# Patient Record
Sex: Female | Born: 1950 | Race: White | Hispanic: No | Marital: Married | Smoking: Never smoker
Health system: Southern US, Community
[De-identification: ages and names within clinical notes are randomized; demographics above are authoritative.]

## PROBLEM LIST (undated history)

## (undated) DIAGNOSIS — F32A Depression, unspecified: Secondary | ICD-10-CM

## (undated) DIAGNOSIS — R7303 Prediabetes: Secondary | ICD-10-CM

## (undated) DIAGNOSIS — F419 Anxiety disorder, unspecified: Secondary | ICD-10-CM

## (undated) DIAGNOSIS — I10 Essential (primary) hypertension: Secondary | ICD-10-CM

## (undated) DIAGNOSIS — M1990 Unspecified osteoarthritis, unspecified site: Secondary | ICD-10-CM

## (undated) HISTORY — PX: CHOLECYSTECTOMY: SHX55

## (undated) HISTORY — PX: APPENDECTOMY: SHX54

## (undated) HISTORY — PX: CATARACT EXTRACTION W/ INTRAOCULAR LENS IMPLANT: SHX1309

## (undated) HISTORY — PX: LAPAROSCOPIC TOTAL HYSTERECTOMY: SUR800

## (undated) HISTORY — PX: HAND SURGERY: SHX662

## (undated) HISTORY — PX: REDUCTION MAMMAPLASTY: SUR839

---

## 1999-10-12 ENCOUNTER — Ambulatory Visit (HOSPITAL_COMMUNITY): Admission: RE | Admit: 1999-10-12 | Discharge: 1999-10-12 | Payer: Self-pay | Admitting: Family Medicine

## 1999-10-12 ENCOUNTER — Encounter: Payer: Self-pay | Admitting: Family Medicine

## 2000-02-03 ENCOUNTER — Ambulatory Visit (HOSPITAL_COMMUNITY): Admission: RE | Admit: 2000-02-03 | Discharge: 2000-02-03 | Payer: Self-pay | Admitting: Family Medicine

## 2000-02-03 ENCOUNTER — Encounter: Payer: Self-pay | Admitting: Family Medicine

## 2011-05-23 ENCOUNTER — Other Ambulatory Visit: Payer: Self-pay | Admitting: Family Medicine

## 2011-05-23 DIAGNOSIS — N281 Cyst of kidney, acquired: Secondary | ICD-10-CM

## 2011-05-28 ENCOUNTER — Ambulatory Visit
Admission: RE | Admit: 2011-05-28 | Discharge: 2011-05-28 | Disposition: A | Discharge status: Home / Self Care | Payer: BC Managed Care – PPO | Source: Ambulatory Visit | Attending: Family Medicine | Admitting: Family Medicine

## 2011-05-28 DIAGNOSIS — N281 Cyst of kidney, acquired: Secondary | ICD-10-CM

## 2011-05-28 MED ORDER — GADOBENATE DIMEGLUMINE 529 MG/ML IV SOLN
14.0000 mL | Freq: Once | INTRAVENOUS | Status: AC | PRN
  Administered 2011-05-28: 14 mL via INTRAVENOUS

## 2014-06-05 ENCOUNTER — Other Ambulatory Visit: Payer: Self-pay

## 2014-06-05 DIAGNOSIS — Z9889 Other specified postprocedural states: Secondary | ICD-10-CM

## 2014-06-05 DIAGNOSIS — Z1231 Encounter for screening mammogram for malignant neoplasm of breast: Secondary | ICD-10-CM

## 2014-06-12 ENCOUNTER — Ambulatory Visit
Admission: RE | Admit: 2014-06-12 | Discharge: 2014-06-12 | Disposition: A | Discharge status: Home / Self Care | Payer: BLUE CROSS/BLUE SHIELD | Source: Ambulatory Visit

## 2014-06-12 DIAGNOSIS — Z9889 Other specified postprocedural states: Secondary | ICD-10-CM

## 2014-06-12 DIAGNOSIS — Z1231 Encounter for screening mammogram for malignant neoplasm of breast: Secondary | ICD-10-CM

## 2016-03-31 IMAGING — MG MM SCREENING BREAST TOMO BILATERAL
8 series · 8 of 24 positions shown · non-contrast
Comparison: None.

CLINICAL DATA: Screening.

EXAM:
DIGITAL SCREENING BILATERAL MAMMOGRAM WITH 3D TOMO WITH CAD

[L MLO]
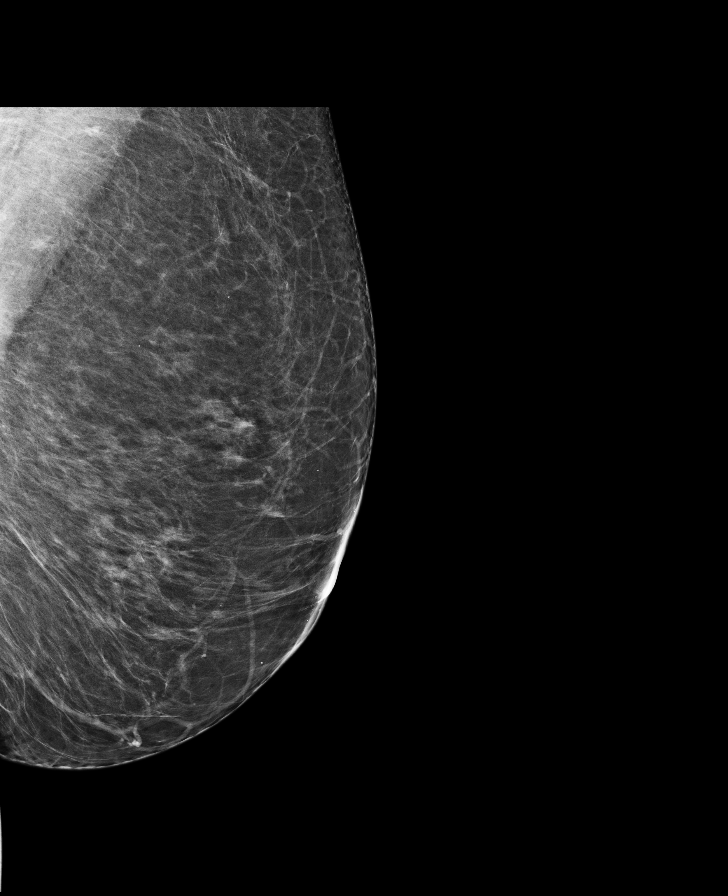

[R CC]
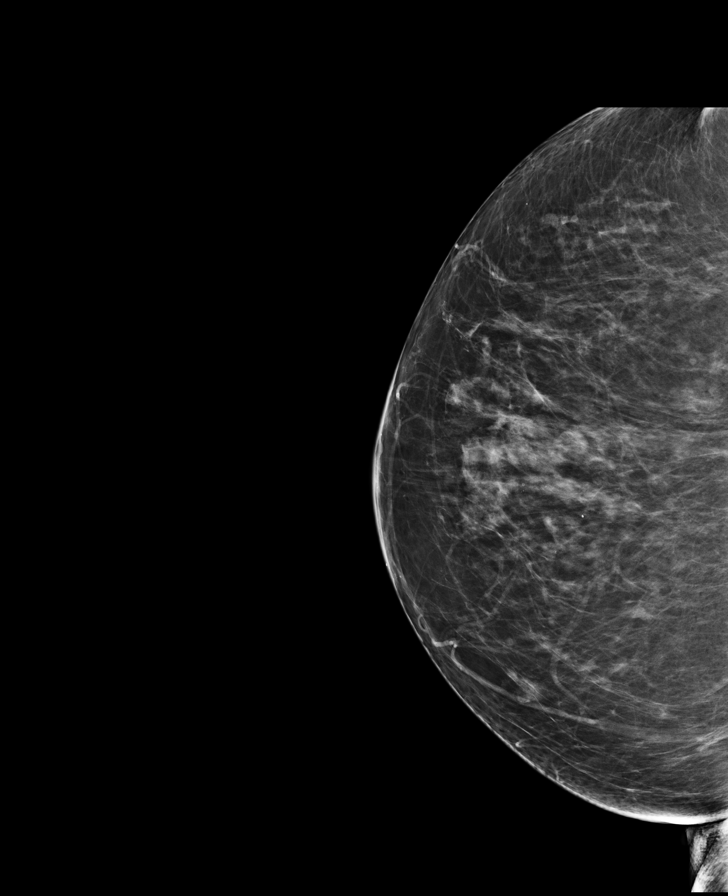

[L CC]
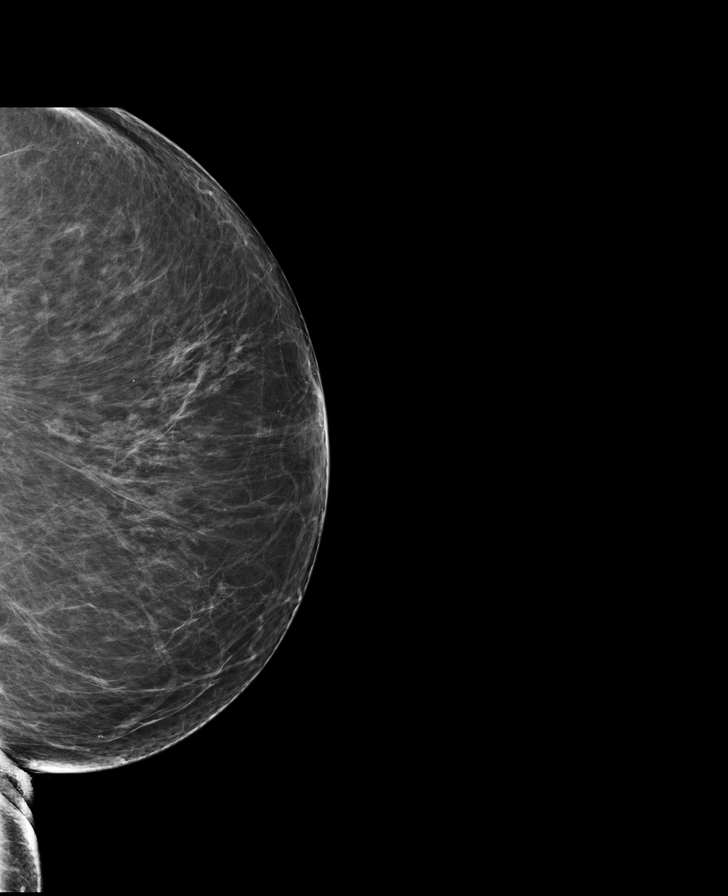

[R MLO]
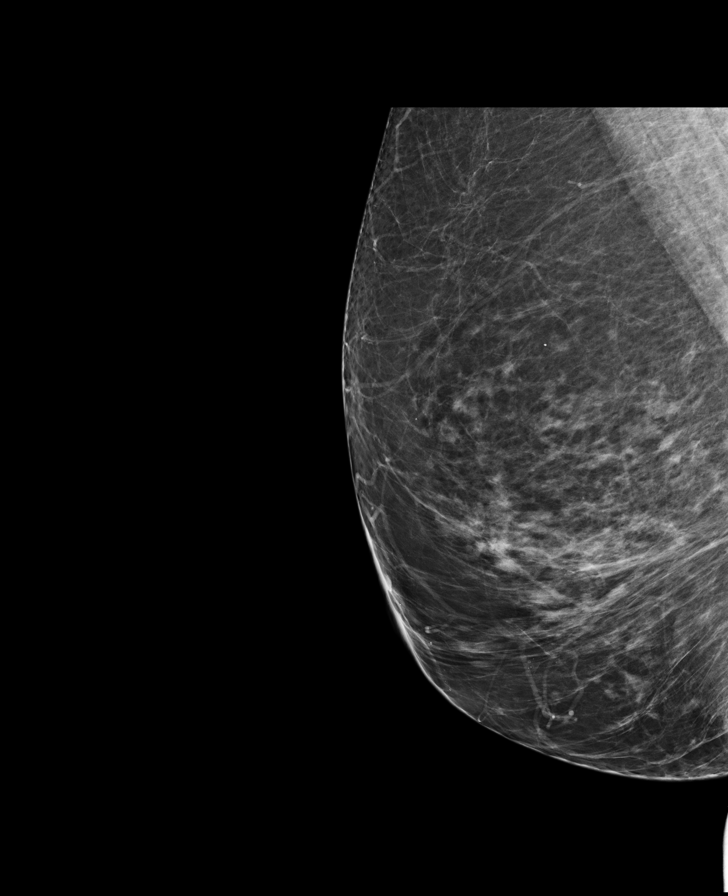

[L CC tomo · tomo slice 46/91.0]
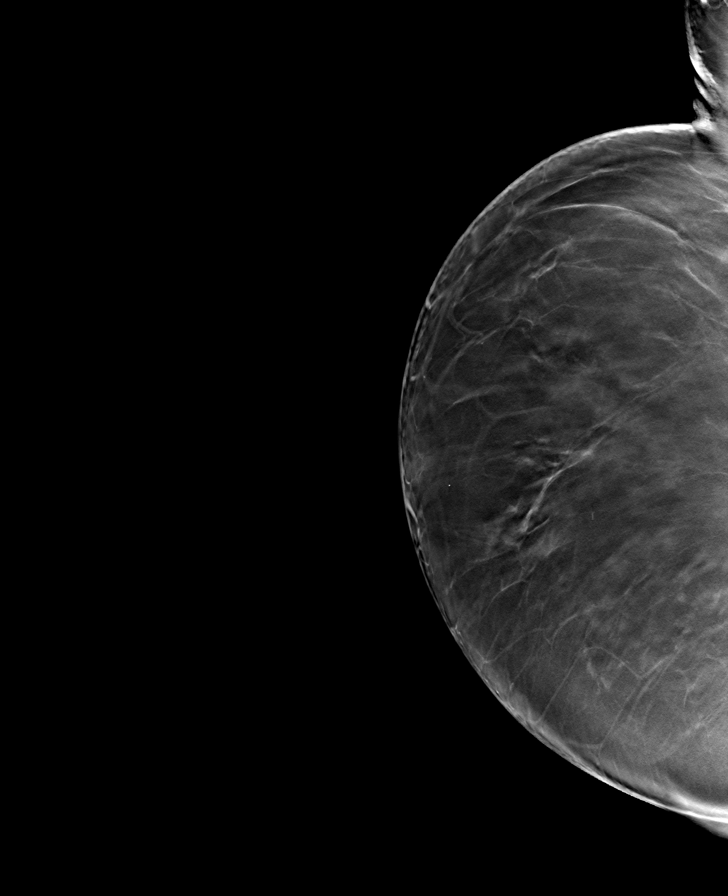

[R MLO tomo · tomo slice 39/78.0]
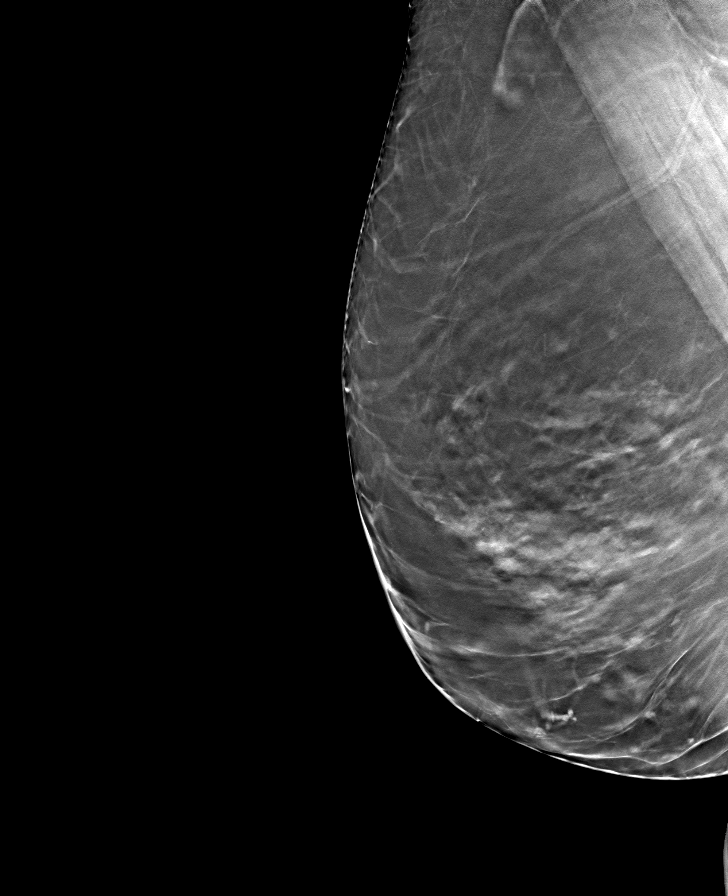

[L MLO tomo · tomo slice 43/85.0]
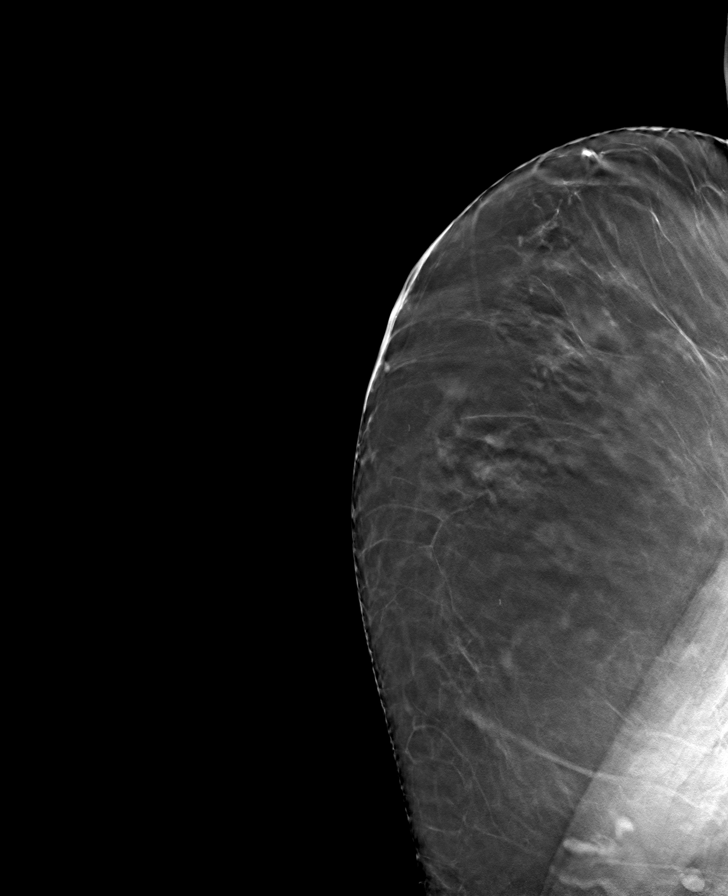

[R CC tomo · tomo slice 41/81.0]
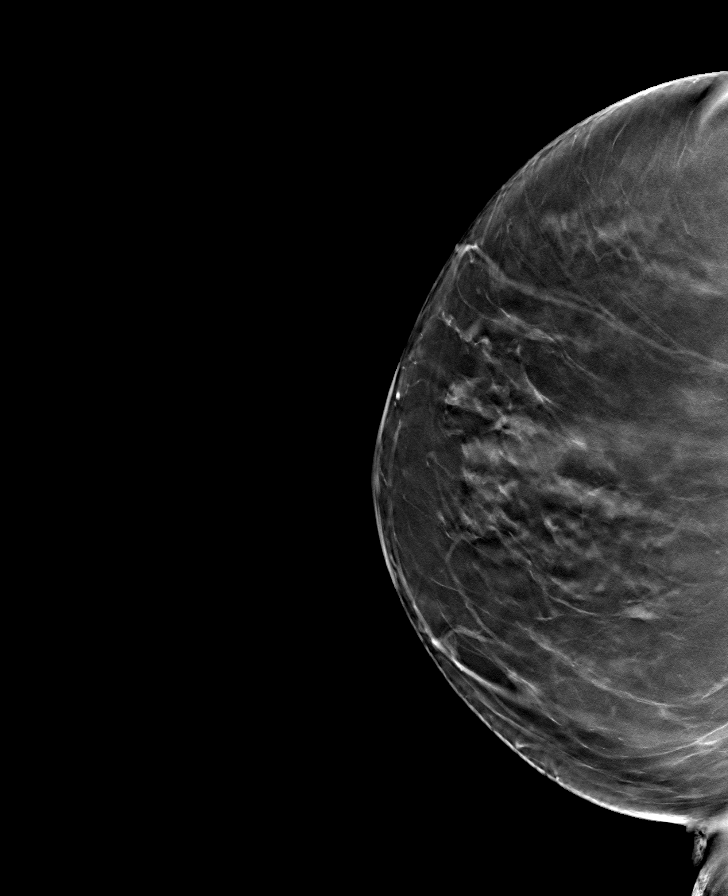

[8 of 24 positions shown; findings below may reference images not displayed]

ACR Breast Density Category b: There are scattered areas of
fibroglandular density.
FINDINGS: There are no findings suspicious for malignancy. Images were
processed with CAD.
IMPRESSION: No mammographic evidence of malignancy. A result letter of this
screening mammogram will be mailed directly to the patient.

RECOMMENDATION:
Screening mammogram in one year. (Code:2K-Q-4CQ)

BI-RADS CATEGORY  1: Negative.

## 2017-10-19 ENCOUNTER — Other Ambulatory Visit: Payer: Self-pay | Admitting: Family Medicine

## 2017-10-19 DIAGNOSIS — N838 Other noninflammatory disorders of ovary, fallopian tube and broad ligament: Secondary | ICD-10-CM

## 2017-10-26 ENCOUNTER — Ambulatory Visit
Admission: RE | Admit: 2017-10-26 | Discharge: 2017-10-26 | Disposition: A | Discharge status: Home / Self Care | Payer: BLUE CROSS/BLUE SHIELD | Source: Ambulatory Visit | Attending: Family Medicine | Admitting: Family Medicine

## 2017-10-26 DIAGNOSIS — N838 Other noninflammatory disorders of ovary, fallopian tube and broad ligament: Secondary | ICD-10-CM

## 2018-04-06 ENCOUNTER — Other Ambulatory Visit: Payer: Self-pay | Admitting: Family Medicine

## 2018-04-06 DIAGNOSIS — Z1231 Encounter for screening mammogram for malignant neoplasm of breast: Secondary | ICD-10-CM

## 2018-05-04 ENCOUNTER — Ambulatory Visit
Admission: RE | Admit: 2018-05-04 | Discharge: 2018-05-04 | Disposition: A | Discharge status: Home / Self Care | Payer: Medicare Other | Source: Ambulatory Visit | Attending: Family Medicine | Admitting: Family Medicine

## 2018-05-04 DIAGNOSIS — Z1231 Encounter for screening mammogram for malignant neoplasm of breast: Secondary | ICD-10-CM

## 2019-08-15 IMAGING — US US PELVIS COMPLETE TRANSABD/TRANSVAG
1 series · 14 of 22 positions shown · non-contrast
Comparison: None

CLINICAL DATA: Patient with history of enlarged right ovary.

EXAM:
TRANSABDOMINAL AND TRANSVAGINAL ULTRASOUND OF PELVIS
TECHNIQUE: Both transabdominal and transvaginal ultrasound examinations of the
pelvis were performed. Transabdominal technique was performed for
global imaging of the pelvis including uterus, ovaries, adnexal
regions, and pelvic cul-de-sac. It was necessary to proceed with
endovaginal exam following the transabdominal exam to visualize the
adnexal structures.

[Series 1: us pelvis complete transabd/transvag · 0.25mm/px · 14 of 22 slices shown]
[im 1/22]
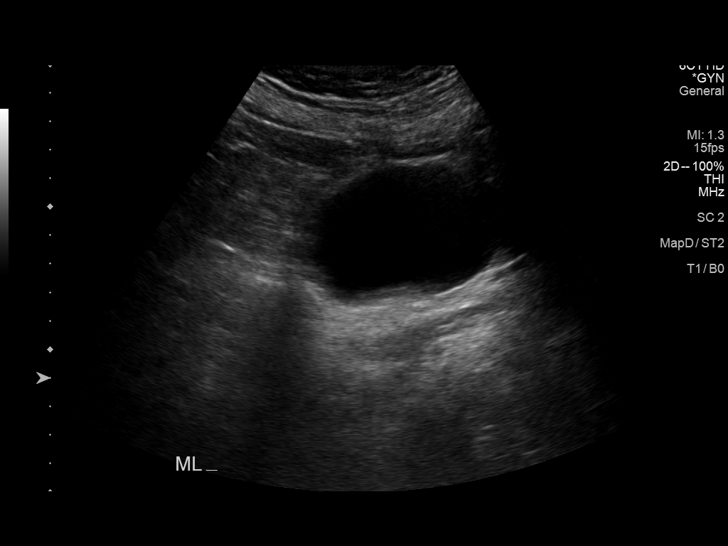
[im 3/22]
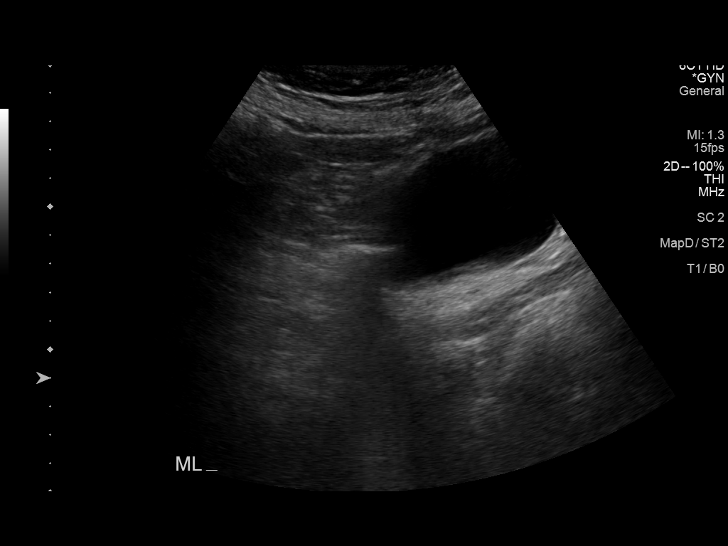
[im 4/22]
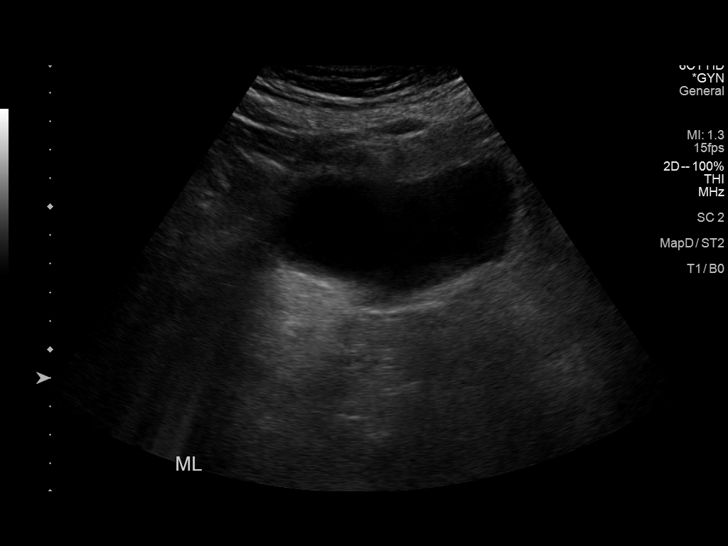
[im 6/22]
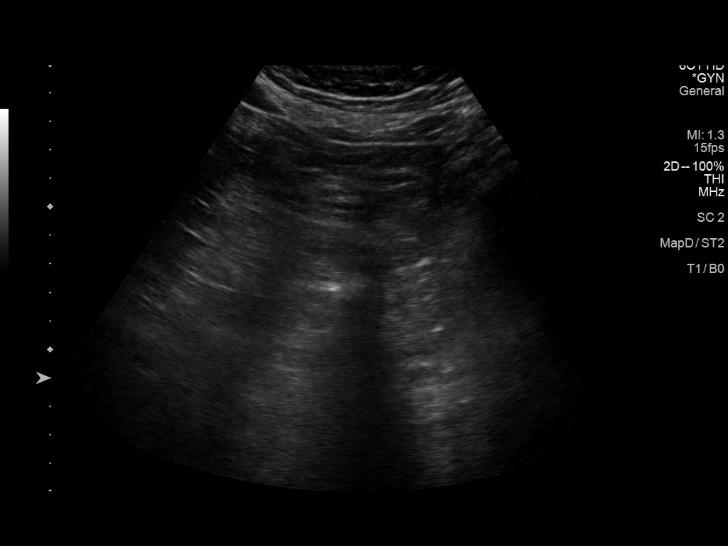
[im 8/22]
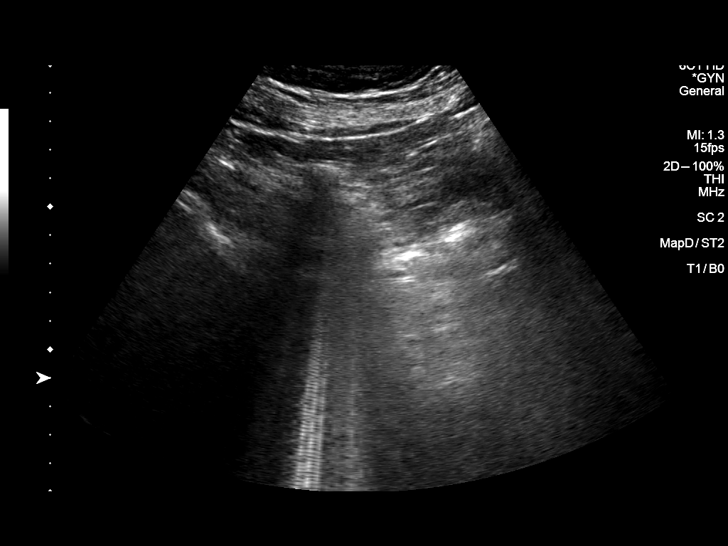
[im 9/22]
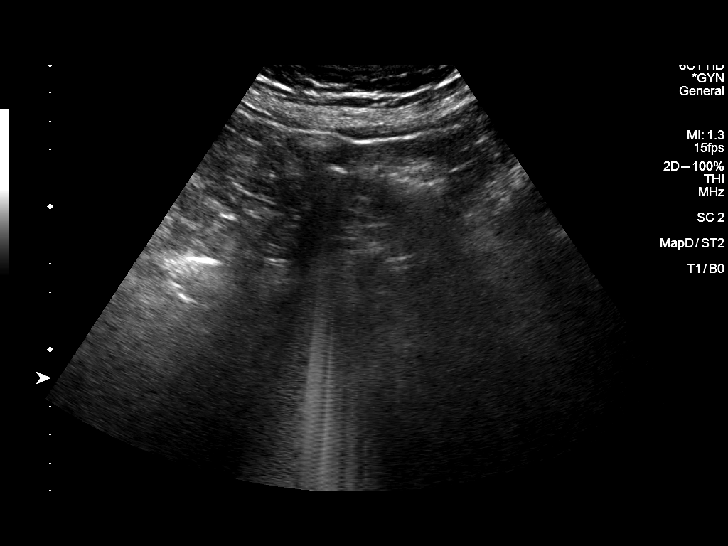
[im 11/22]
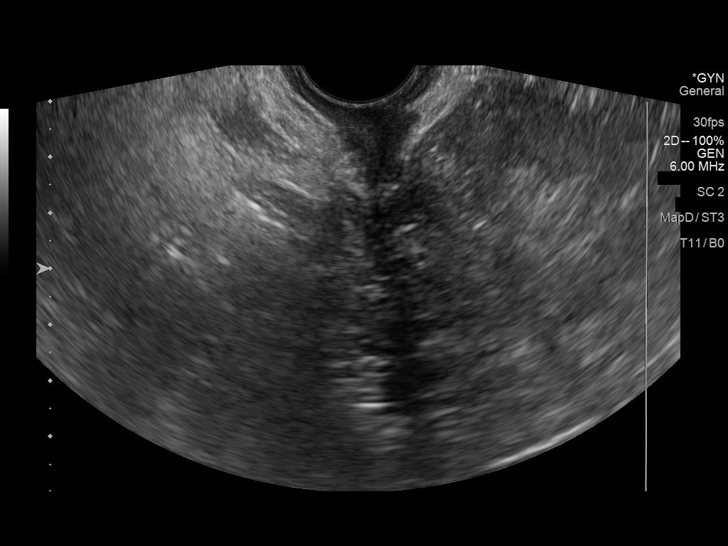
[im 12/22]
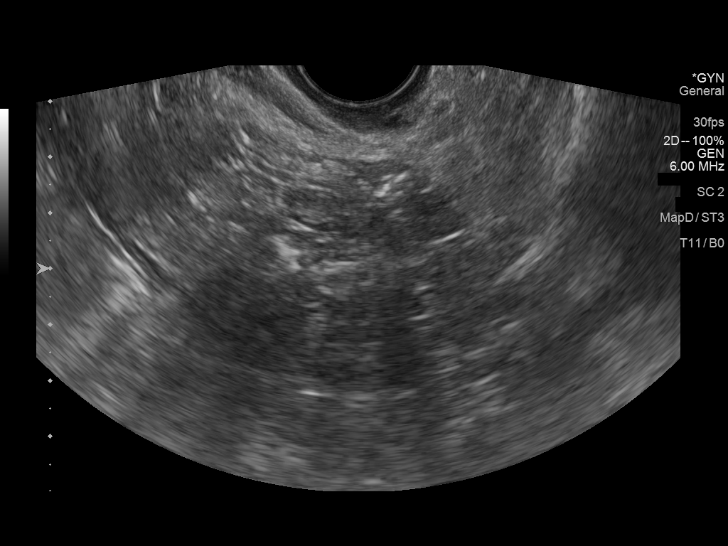
[im 14/22]
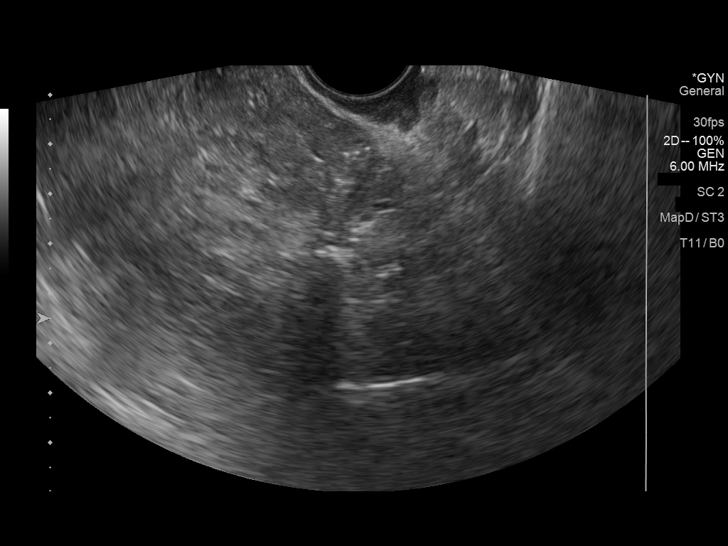
[im 15/22]
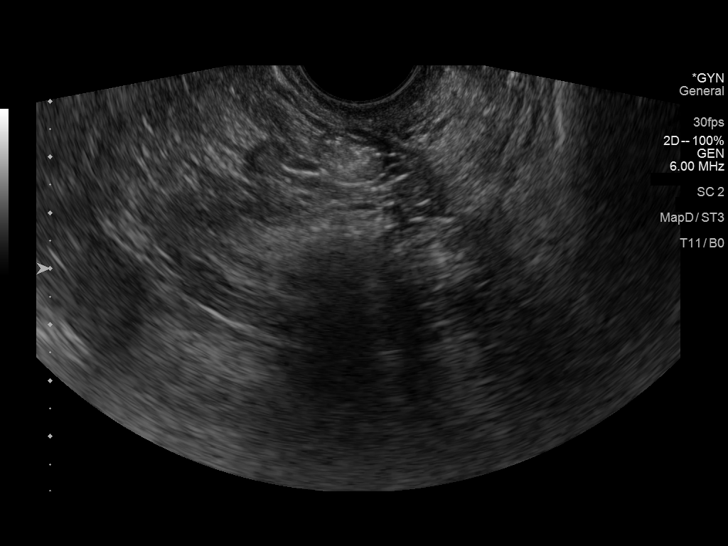
[im 17/22]
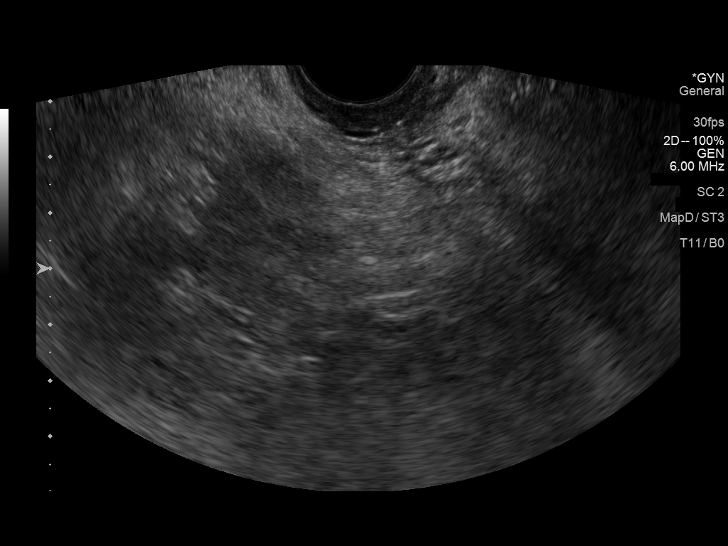
[im 19/22]
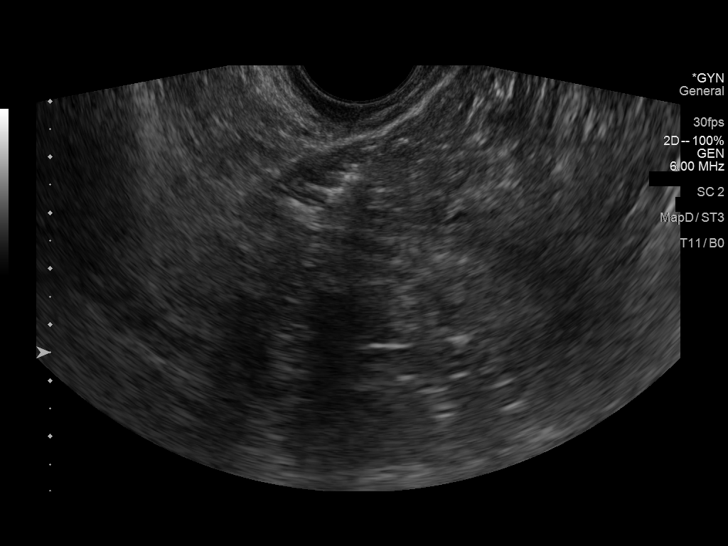
[im 20/22]
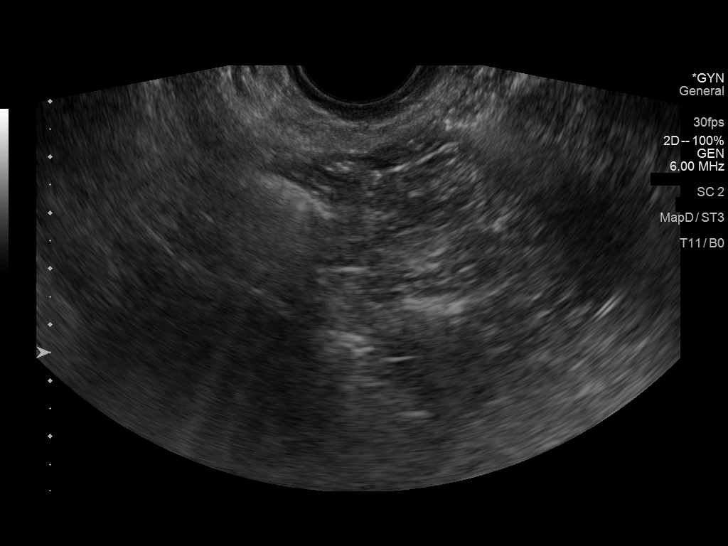
[im 22/22]
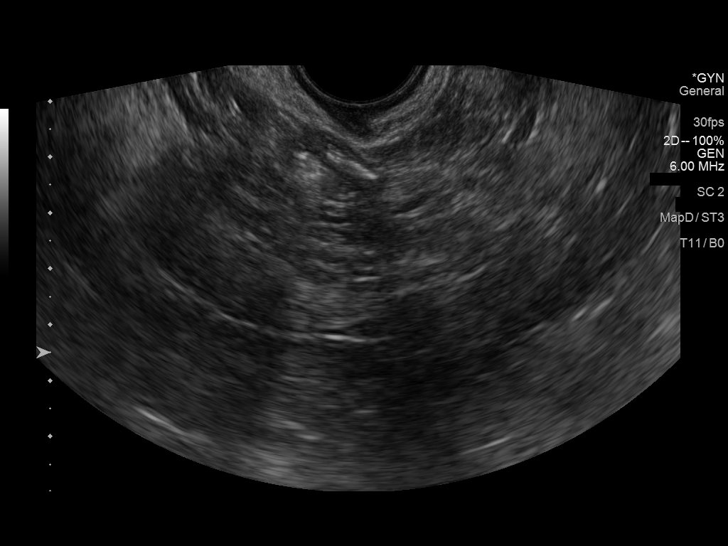

[14 of 22 positions shown; findings below may reference images not displayed]

FINDINGS: Uterus

Surgically absent

Right ovary

Not visualized.

Left ovary

Not visualized.

Other findings

No abnormal free fluid.
IMPRESSION: Status post hysterectomy.  No pelvic masses identified.

## 2019-11-04 ENCOUNTER — Other Ambulatory Visit: Payer: Self-pay | Admitting: Family Medicine

## 2019-11-04 DIAGNOSIS — M8580 Other specified disorders of bone density and structure, unspecified site: Secondary | ICD-10-CM

## 2019-11-04 DIAGNOSIS — Z1231 Encounter for screening mammogram for malignant neoplasm of breast: Secondary | ICD-10-CM

## 2020-02-13 ENCOUNTER — Other Ambulatory Visit: Payer: Self-pay

## 2020-02-13 ENCOUNTER — Ambulatory Visit
Admission: RE | Admit: 2020-02-13 | Discharge: 2020-02-13 | Disposition: A | Discharge status: Home / Self Care | Payer: Medicare Other | Source: Ambulatory Visit | Attending: Family Medicine | Admitting: Family Medicine

## 2020-02-13 DIAGNOSIS — Z1231 Encounter for screening mammogram for malignant neoplasm of breast: Secondary | ICD-10-CM

## 2020-02-13 DIAGNOSIS — M8580 Other specified disorders of bone density and structure, unspecified site: Secondary | ICD-10-CM

## 2020-02-21 IMAGING — MG DIGITAL SCREENING BILATERAL MAMMOGRAM WITH CAD
3 series · 3 of 3 positions shown · non-contrast
Comparison: Previous exam(s).

CLINICAL DATA: Screening. Post bilateral reduction 1980s.

EXAM:
DIGITAL SCREENING BILATERAL MAMMOGRAM WITH CAD

[R MLO (1 of 2)]
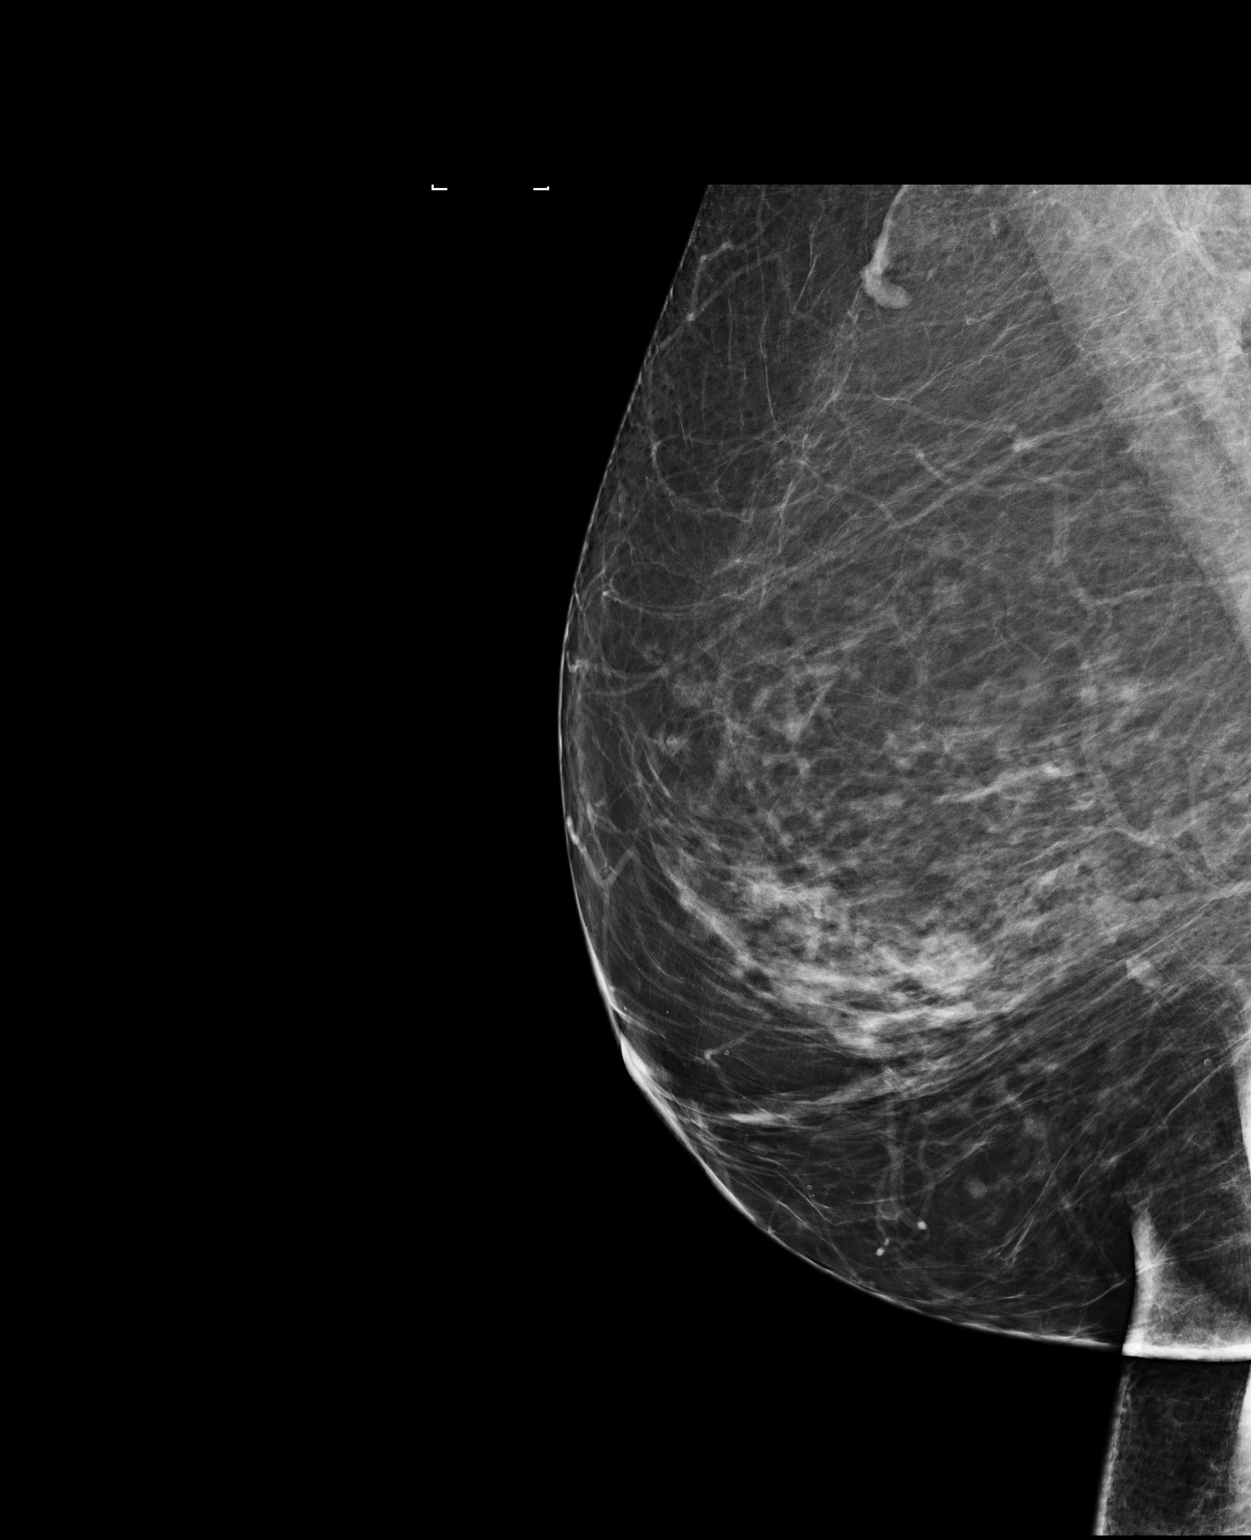

[R MLO (2 of 2)]
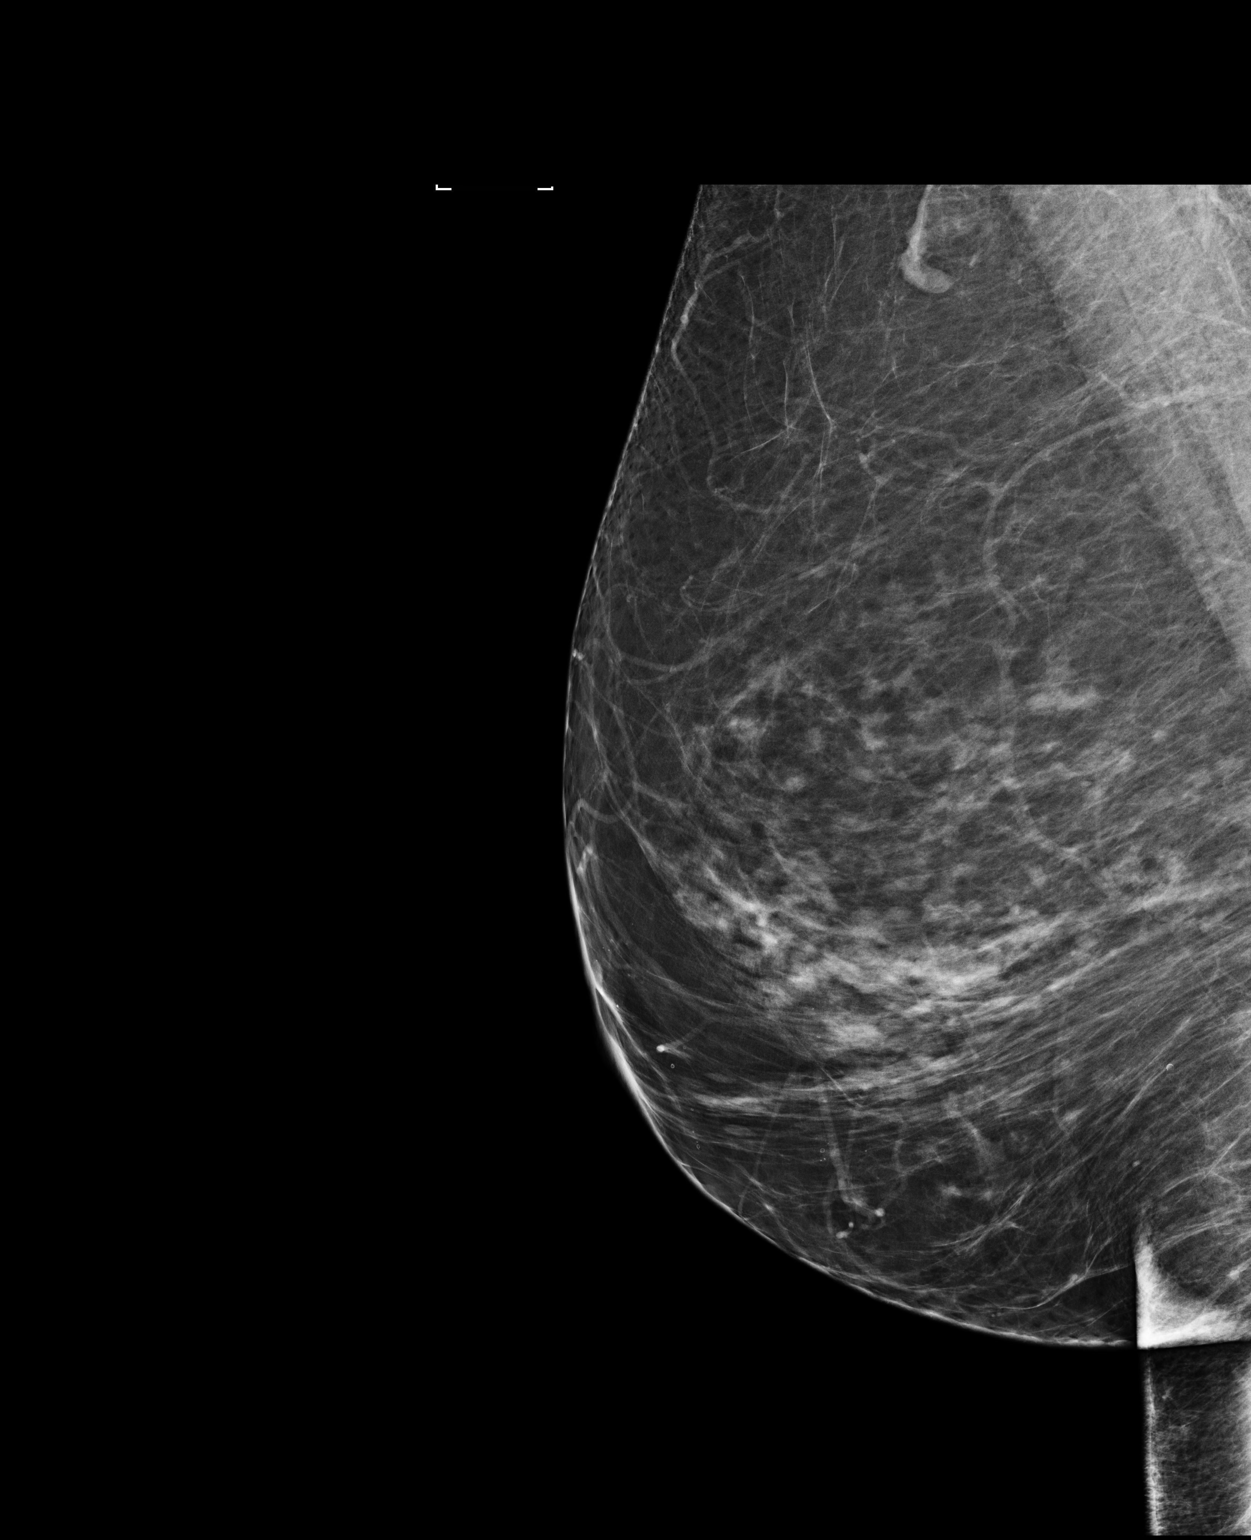

[L MLO]
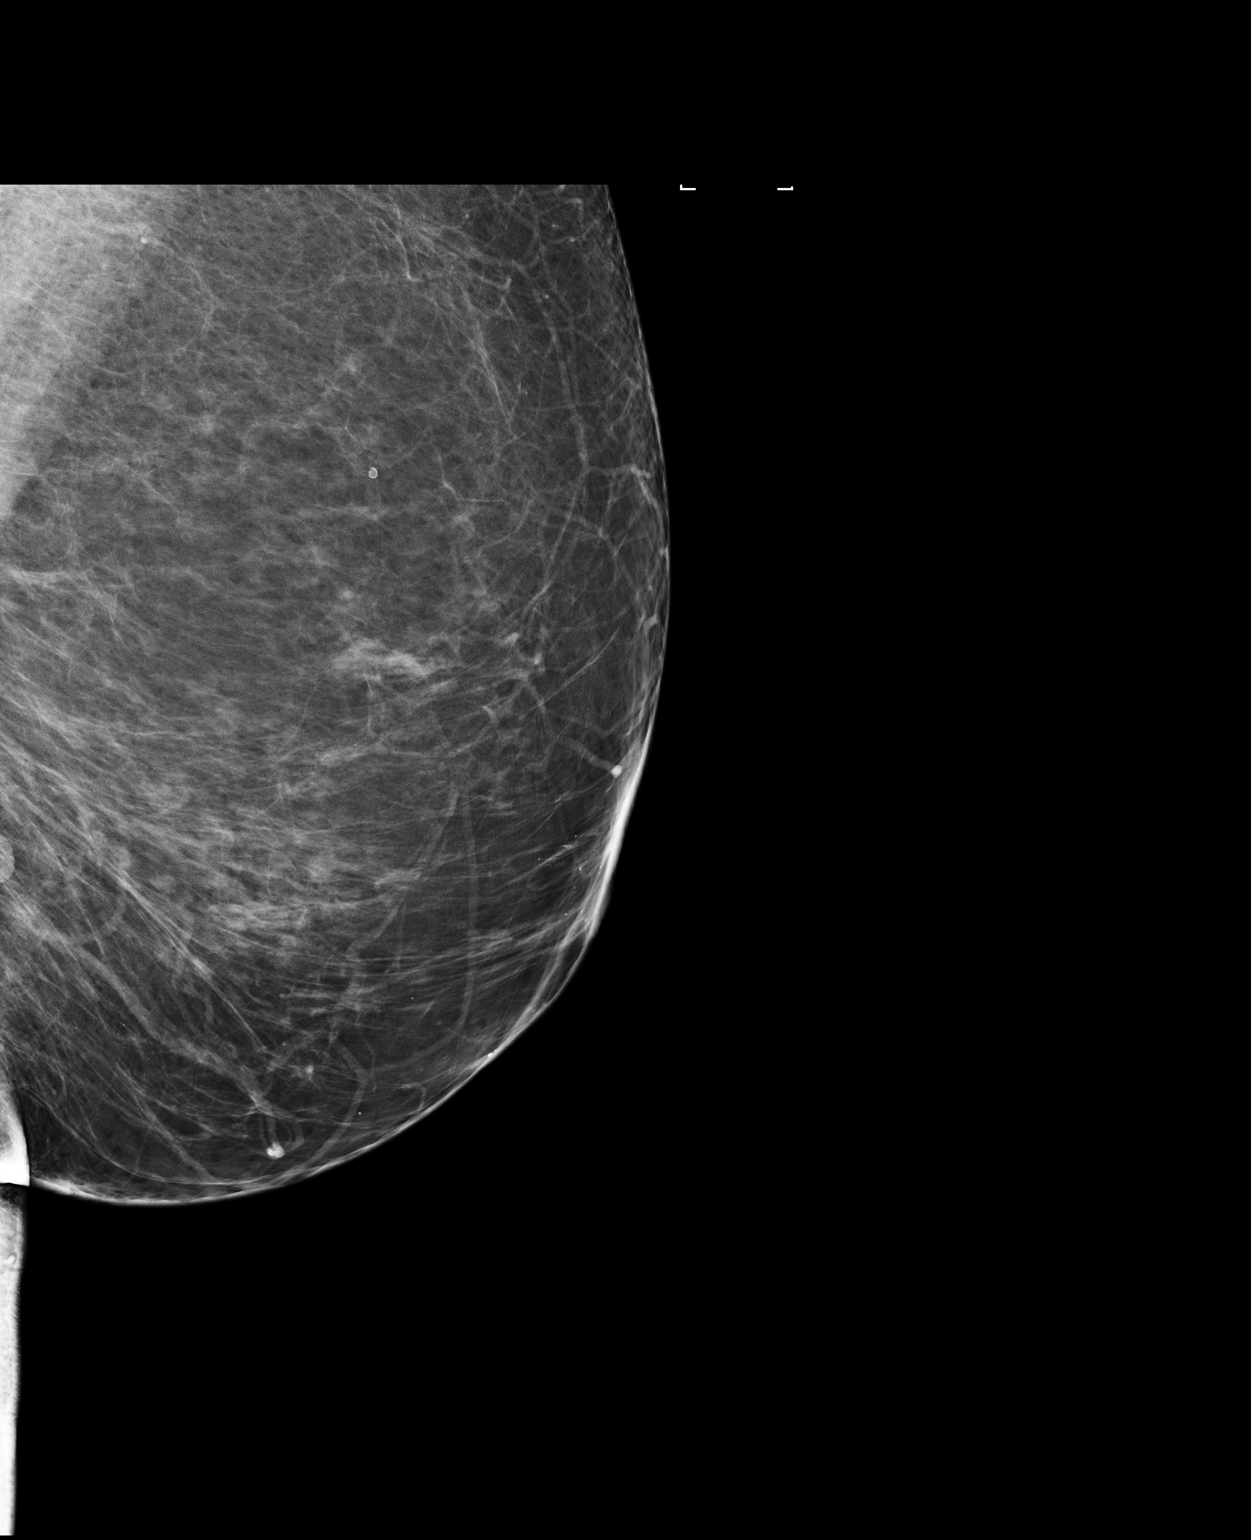

[3 of 3 positions shown; findings below may reference images not displayed]

ACR Breast Density Category b: There are scattered areas of
fibroglandular density.
FINDINGS: There are no findings suspicious for malignancy. Images were
processed with CAD.
IMPRESSION: No mammographic evidence of malignancy. A result letter of this
screening mammogram will be mailed directly to the patient.

RECOMMENDATION:
Screening mammogram in one year. (Code:[id number])

BI-RADS CATEGORY  1: Negative.

## 2020-04-28 DIAGNOSIS — E785 Hyperlipidemia, unspecified: Secondary | ICD-10-CM | POA: Diagnosis not present

## 2020-04-28 DIAGNOSIS — Z8616 Personal history of COVID-19: Secondary | ICD-10-CM | POA: Diagnosis not present

## 2020-04-28 DIAGNOSIS — I10 Essential (primary) hypertension: Secondary | ICD-10-CM | POA: Diagnosis not present

## 2020-06-16 DIAGNOSIS — R6889 Other general symptoms and signs: Secondary | ICD-10-CM | POA: Diagnosis not present

## 2020-07-31 DIAGNOSIS — E785 Hyperlipidemia, unspecified: Secondary | ICD-10-CM | POA: Diagnosis not present

## 2020-07-31 DIAGNOSIS — I10 Essential (primary) hypertension: Secondary | ICD-10-CM | POA: Diagnosis not present

## 2020-11-04 ENCOUNTER — Other Ambulatory Visit: Payer: Self-pay

## 2020-11-04 ENCOUNTER — Other Ambulatory Visit: Payer: Self-pay | Admitting: Family Medicine

## 2020-11-04 ENCOUNTER — Ambulatory Visit
Admission: RE | Admit: 2020-11-04 | Discharge: 2020-11-04 | Disposition: A | Discharge status: Home / Self Care | Payer: Medicare Other | Source: Ambulatory Visit | Attending: Family Medicine | Admitting: Family Medicine

## 2020-11-04 DIAGNOSIS — I10 Essential (primary) hypertension: Secondary | ICD-10-CM | POA: Diagnosis not present

## 2020-11-04 DIAGNOSIS — G542 Cervical root disorders, not elsewhere classified: Secondary | ICD-10-CM

## 2020-11-04 DIAGNOSIS — R7303 Prediabetes: Secondary | ICD-10-CM | POA: Diagnosis not present

## 2020-11-04 DIAGNOSIS — Z0000 Encounter for general adult medical examination without abnormal findings: Secondary | ICD-10-CM | POA: Diagnosis not present

## 2020-11-04 DIAGNOSIS — M542 Cervicalgia: Secondary | ICD-10-CM | POA: Diagnosis not present

## 2020-11-04 DIAGNOSIS — E785 Hyperlipidemia, unspecified: Secondary | ICD-10-CM | POA: Diagnosis not present

## 2020-11-16 DIAGNOSIS — M1811 Unilateral primary osteoarthritis of first carpometacarpal joint, right hand: Secondary | ICD-10-CM | POA: Diagnosis not present

## 2020-11-16 DIAGNOSIS — M79641 Pain in right hand: Secondary | ICD-10-CM | POA: Diagnosis not present

## 2020-11-20 DIAGNOSIS — R7303 Prediabetes: Secondary | ICD-10-CM | POA: Diagnosis not present

## 2020-11-20 DIAGNOSIS — E785 Hyperlipidemia, unspecified: Secondary | ICD-10-CM | POA: Diagnosis not present

## 2020-11-20 DIAGNOSIS — I10 Essential (primary) hypertension: Secondary | ICD-10-CM | POA: Diagnosis not present

## 2020-12-15 DIAGNOSIS — M13131 Monoarthritis, not elsewhere classified, right wrist: Secondary | ICD-10-CM | POA: Diagnosis not present

## 2020-12-15 DIAGNOSIS — M1811 Unilateral primary osteoarthritis of first carpometacarpal joint, right hand: Secondary | ICD-10-CM | POA: Diagnosis not present

## 2020-12-30 DIAGNOSIS — Z4789 Encounter for other orthopedic aftercare: Secondary | ICD-10-CM | POA: Diagnosis not present

## 2021-01-07 DIAGNOSIS — I10 Essential (primary) hypertension: Secondary | ICD-10-CM | POA: Diagnosis not present

## 2021-01-07 DIAGNOSIS — E785 Hyperlipidemia, unspecified: Secondary | ICD-10-CM | POA: Diagnosis not present

## 2021-01-14 DIAGNOSIS — M25641 Stiffness of right hand, not elsewhere classified: Secondary | ICD-10-CM | POA: Diagnosis not present

## 2021-01-14 DIAGNOSIS — Z4789 Encounter for other orthopedic aftercare: Secondary | ICD-10-CM | POA: Diagnosis not present

## 2021-01-19 DIAGNOSIS — M25641 Stiffness of right hand, not elsewhere classified: Secondary | ICD-10-CM | POA: Diagnosis not present

## 2021-01-20 DIAGNOSIS — D3131 Benign neoplasm of right choroid: Secondary | ICD-10-CM | POA: Diagnosis not present

## 2021-01-21 DIAGNOSIS — R6889 Other general symptoms and signs: Secondary | ICD-10-CM | POA: Diagnosis not present

## 2021-01-26 DIAGNOSIS — M25641 Stiffness of right hand, not elsewhere classified: Secondary | ICD-10-CM | POA: Diagnosis not present

## 2021-02-08 DIAGNOSIS — Z4789 Encounter for other orthopedic aftercare: Secondary | ICD-10-CM | POA: Diagnosis not present

## 2021-02-10 DIAGNOSIS — M25641 Stiffness of right hand, not elsewhere classified: Secondary | ICD-10-CM | POA: Diagnosis not present

## 2021-02-17 DIAGNOSIS — L821 Other seborrheic keratosis: Secondary | ICD-10-CM | POA: Diagnosis not present

## 2021-02-17 DIAGNOSIS — L570 Actinic keratosis: Secondary | ICD-10-CM | POA: Diagnosis not present

## 2021-02-17 DIAGNOSIS — C44319 Basal cell carcinoma of skin of other parts of face: Secondary | ICD-10-CM | POA: Diagnosis not present

## 2021-02-17 DIAGNOSIS — L578 Other skin changes due to chronic exposure to nonionizing radiation: Secondary | ICD-10-CM | POA: Diagnosis not present

## 2021-02-17 DIAGNOSIS — D485 Neoplasm of uncertain behavior of skin: Secondary | ICD-10-CM | POA: Diagnosis not present

## 2021-03-02 DIAGNOSIS — M25641 Stiffness of right hand, not elsewhere classified: Secondary | ICD-10-CM | POA: Diagnosis not present

## 2021-03-18 DIAGNOSIS — Z4789 Encounter for other orthopedic aftercare: Secondary | ICD-10-CM | POA: Diagnosis not present

## 2021-03-18 DIAGNOSIS — M25641 Stiffness of right hand, not elsewhere classified: Secondary | ICD-10-CM | POA: Diagnosis not present

## 2021-05-06 DIAGNOSIS — C44319 Basal cell carcinoma of skin of other parts of face: Secondary | ICD-10-CM | POA: Diagnosis not present

## 2021-05-06 DIAGNOSIS — M7061 Trochanteric bursitis, right hip: Secondary | ICD-10-CM | POA: Diagnosis not present

## 2021-05-06 DIAGNOSIS — M7062 Trochanteric bursitis, left hip: Secondary | ICD-10-CM | POA: Diagnosis not present

## 2021-05-06 DIAGNOSIS — E785 Hyperlipidemia, unspecified: Secondary | ICD-10-CM | POA: Diagnosis not present

## 2021-05-06 DIAGNOSIS — I10 Essential (primary) hypertension: Secondary | ICD-10-CM | POA: Diagnosis not present

## 2021-05-06 DIAGNOSIS — M4302 Spondylolysis, cervical region: Secondary | ICD-10-CM | POA: Diagnosis not present

## 2021-05-06 DIAGNOSIS — R7303 Prediabetes: Secondary | ICD-10-CM | POA: Diagnosis not present

## 2021-05-25 DIAGNOSIS — L578 Other skin changes due to chronic exposure to nonionizing radiation: Secondary | ICD-10-CM | POA: Diagnosis not present

## 2021-05-25 DIAGNOSIS — C44319 Basal cell carcinoma of skin of other parts of face: Secondary | ICD-10-CM | POA: Diagnosis not present

## 2021-05-25 DIAGNOSIS — D225 Melanocytic nevi of trunk: Secondary | ICD-10-CM | POA: Diagnosis not present

## 2021-05-25 DIAGNOSIS — L570 Actinic keratosis: Secondary | ICD-10-CM | POA: Diagnosis not present

## 2021-06-03 DIAGNOSIS — C44319 Basal cell carcinoma of skin of other parts of face: Secondary | ICD-10-CM | POA: Diagnosis not present

## 2021-06-16 DIAGNOSIS — M7061 Trochanteric bursitis, right hip: Secondary | ICD-10-CM | POA: Diagnosis not present

## 2021-06-16 DIAGNOSIS — M542 Cervicalgia: Secondary | ICD-10-CM | POA: Diagnosis not present

## 2021-06-16 DIAGNOSIS — M7062 Trochanteric bursitis, left hip: Secondary | ICD-10-CM | POA: Diagnosis not present

## 2021-06-23 DIAGNOSIS — M7062 Trochanteric bursitis, left hip: Secondary | ICD-10-CM | POA: Diagnosis not present

## 2021-06-23 DIAGNOSIS — M542 Cervicalgia: Secondary | ICD-10-CM | POA: Diagnosis not present

## 2021-06-23 DIAGNOSIS — M7061 Trochanteric bursitis, right hip: Secondary | ICD-10-CM | POA: Diagnosis not present

## 2021-07-14 DIAGNOSIS — M542 Cervicalgia: Secondary | ICD-10-CM | POA: Diagnosis not present

## 2021-07-14 DIAGNOSIS — M7061 Trochanteric bursitis, right hip: Secondary | ICD-10-CM | POA: Diagnosis not present

## 2021-07-14 DIAGNOSIS — M7062 Trochanteric bursitis, left hip: Secondary | ICD-10-CM | POA: Diagnosis not present

## 2021-07-27 DIAGNOSIS — M7061 Trochanteric bursitis, right hip: Secondary | ICD-10-CM | POA: Diagnosis not present

## 2021-07-27 DIAGNOSIS — M7062 Trochanteric bursitis, left hip: Secondary | ICD-10-CM | POA: Diagnosis not present

## 2021-07-27 DIAGNOSIS — M542 Cervicalgia: Secondary | ICD-10-CM | POA: Diagnosis not present

## 2021-08-04 DIAGNOSIS — M7061 Trochanteric bursitis, right hip: Secondary | ICD-10-CM | POA: Diagnosis not present

## 2021-08-04 DIAGNOSIS — M7062 Trochanteric bursitis, left hip: Secondary | ICD-10-CM | POA: Diagnosis not present

## 2021-08-04 DIAGNOSIS — M542 Cervicalgia: Secondary | ICD-10-CM | POA: Diagnosis not present

## 2021-09-24 ENCOUNTER — Other Ambulatory Visit: Payer: Self-pay | Admitting: Physician Assistant

## 2021-09-24 DIAGNOSIS — M4302 Spondylolysis, cervical region: Secondary | ICD-10-CM

## 2021-10-04 ENCOUNTER — Ambulatory Visit
Admission: RE | Admit: 2021-10-04 | Discharge: 2021-10-04 | Disposition: A | Discharge status: Home / Self Care | Payer: Medicare Other | Source: Ambulatory Visit | Attending: Physician Assistant | Admitting: Physician Assistant

## 2021-10-04 DIAGNOSIS — M4802 Spinal stenosis, cervical region: Secondary | ICD-10-CM | POA: Diagnosis not present

## 2021-10-04 DIAGNOSIS — M47812 Spondylosis without myelopathy or radiculopathy, cervical region: Secondary | ICD-10-CM | POA: Diagnosis not present

## 2021-10-04 DIAGNOSIS — M542 Cervicalgia: Secondary | ICD-10-CM | POA: Diagnosis not present

## 2021-10-04 DIAGNOSIS — M4302 Spondylolysis, cervical region: Secondary | ICD-10-CM

## 2021-11-17 DIAGNOSIS — Z0000 Encounter for general adult medical examination without abnormal findings: Secondary | ICD-10-CM | POA: Diagnosis not present

## 2021-11-17 DIAGNOSIS — E785 Hyperlipidemia, unspecified: Secondary | ICD-10-CM | POA: Diagnosis not present

## 2021-11-17 DIAGNOSIS — M4302 Spondylolysis, cervical region: Secondary | ICD-10-CM | POA: Diagnosis not present

## 2021-11-17 DIAGNOSIS — I10 Essential (primary) hypertension: Secondary | ICD-10-CM | POA: Diagnosis not present

## 2021-11-17 DIAGNOSIS — R7303 Prediabetes: Secondary | ICD-10-CM | POA: Diagnosis not present

## 2021-11-23 DIAGNOSIS — M5412 Radiculopathy, cervical region: Secondary | ICD-10-CM | POA: Diagnosis not present

## 2021-11-23 DIAGNOSIS — M542 Cervicalgia: Secondary | ICD-10-CM | POA: Diagnosis not present

## 2021-12-02 IMAGING — MG DIGITAL SCREENING BILAT W/ CAD
6 series · 6 of 6 positions shown · non-contrast
Comparison: Previous exam(s).

CLINICAL DATA: Screening.

EXAM:
DIGITAL SCREENING BILATERAL MAMMOGRAM WITH CAD

[L CC]
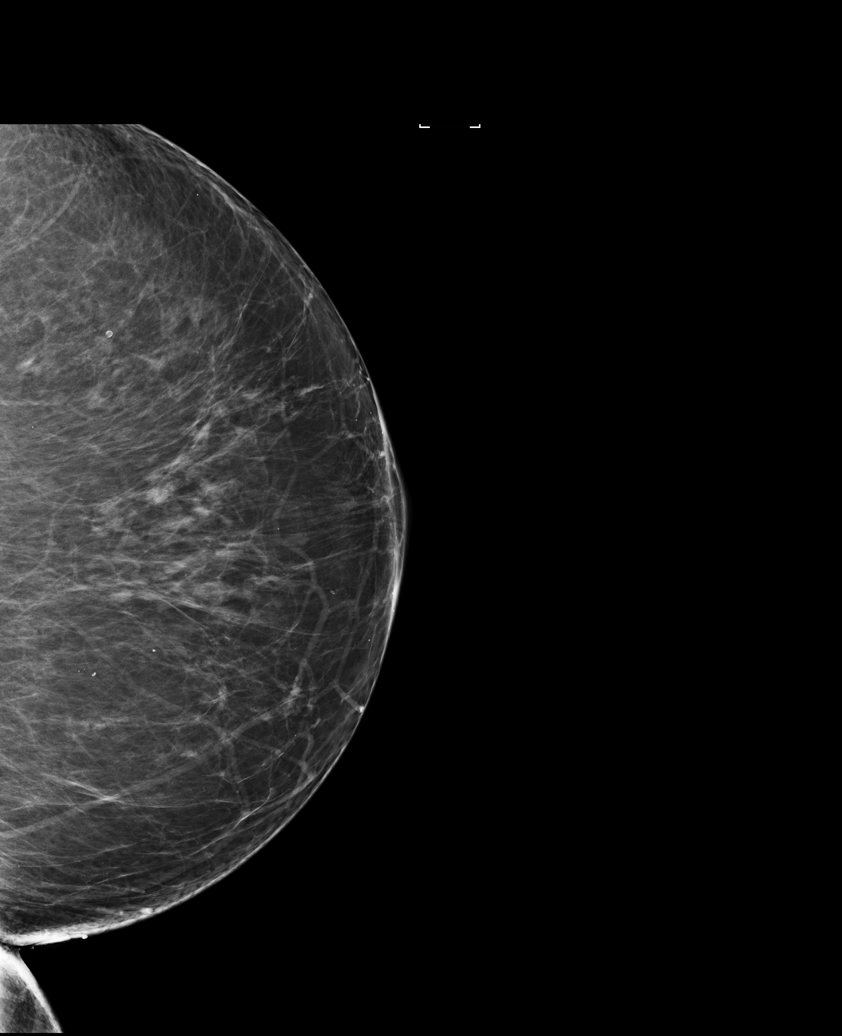

[R MLO (1 of 3)]
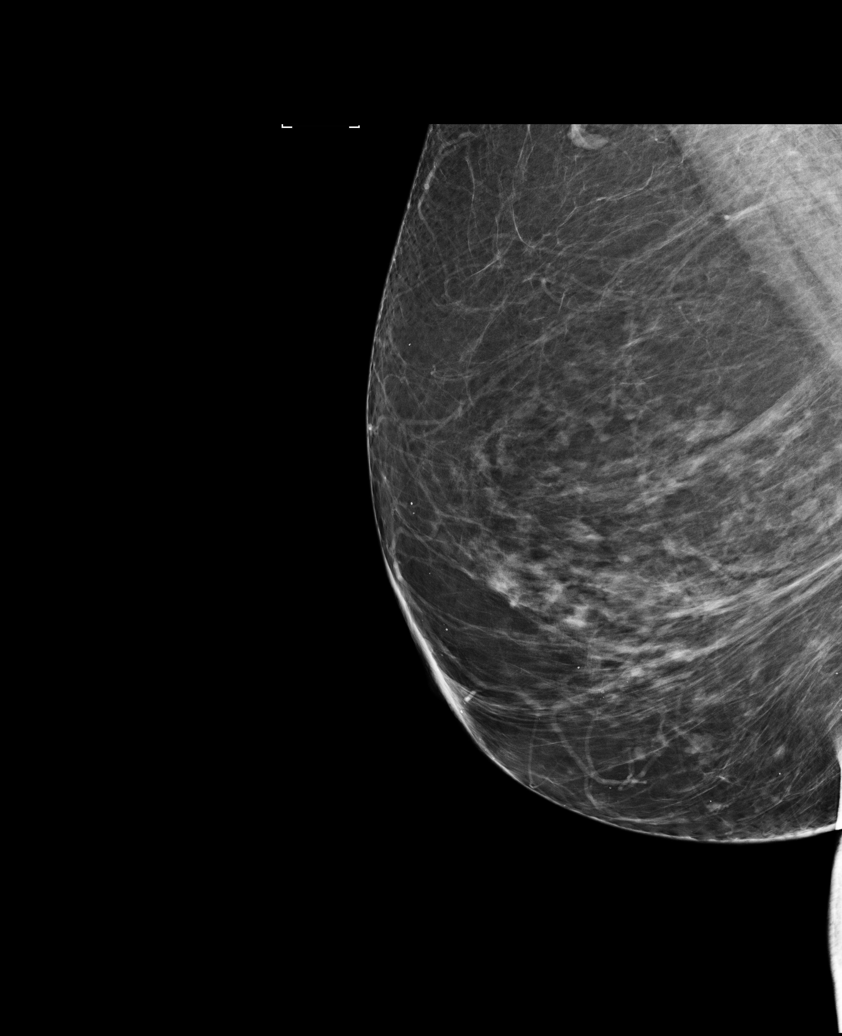

[R MLO (2 of 3)]
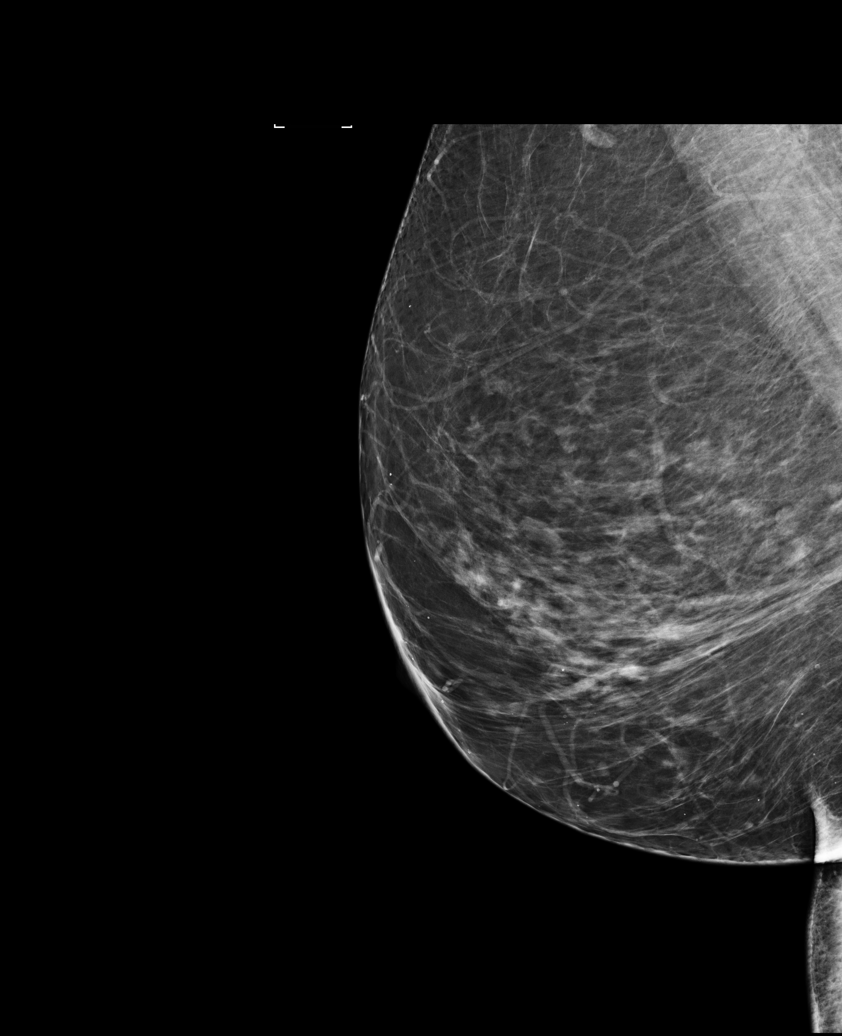

[R MLO (3 of 3)]
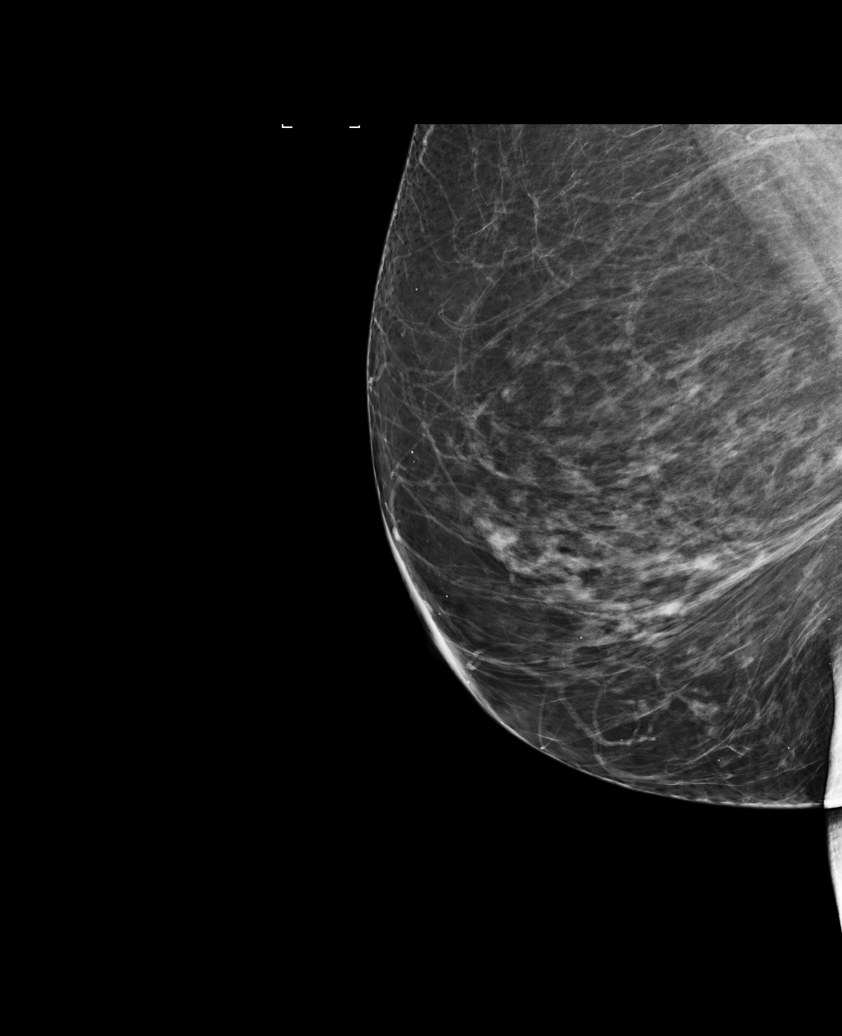

[R CC]
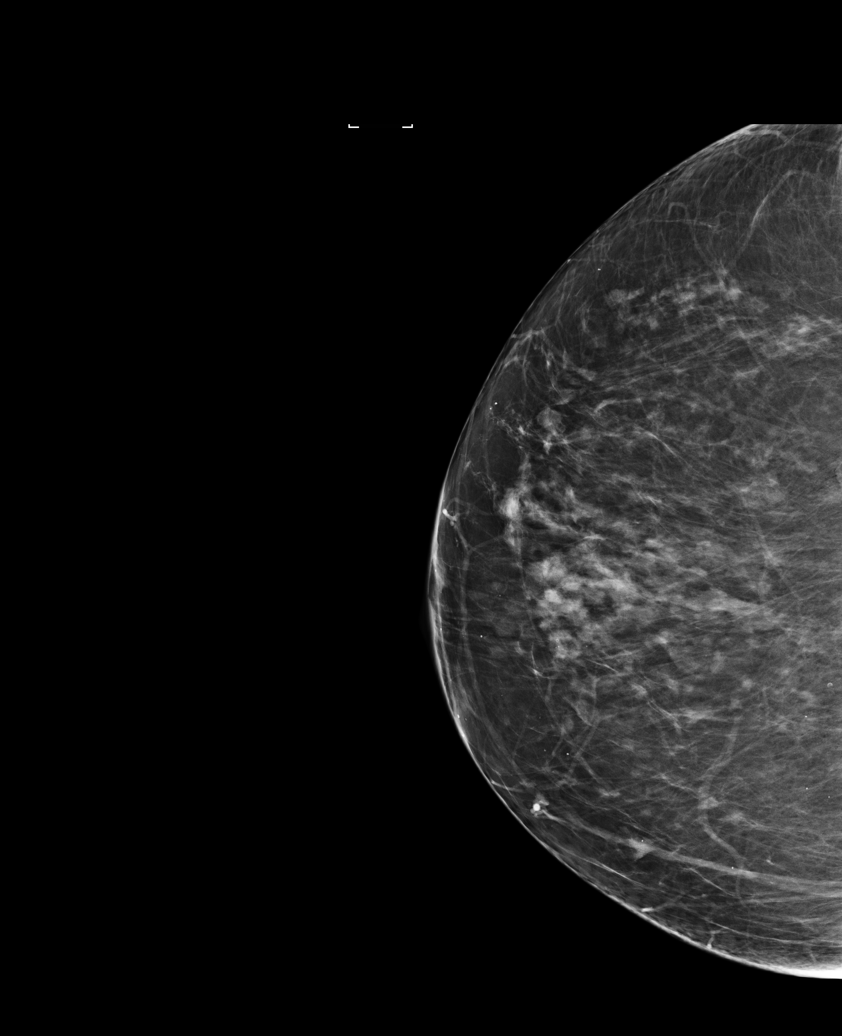

[L MLO]
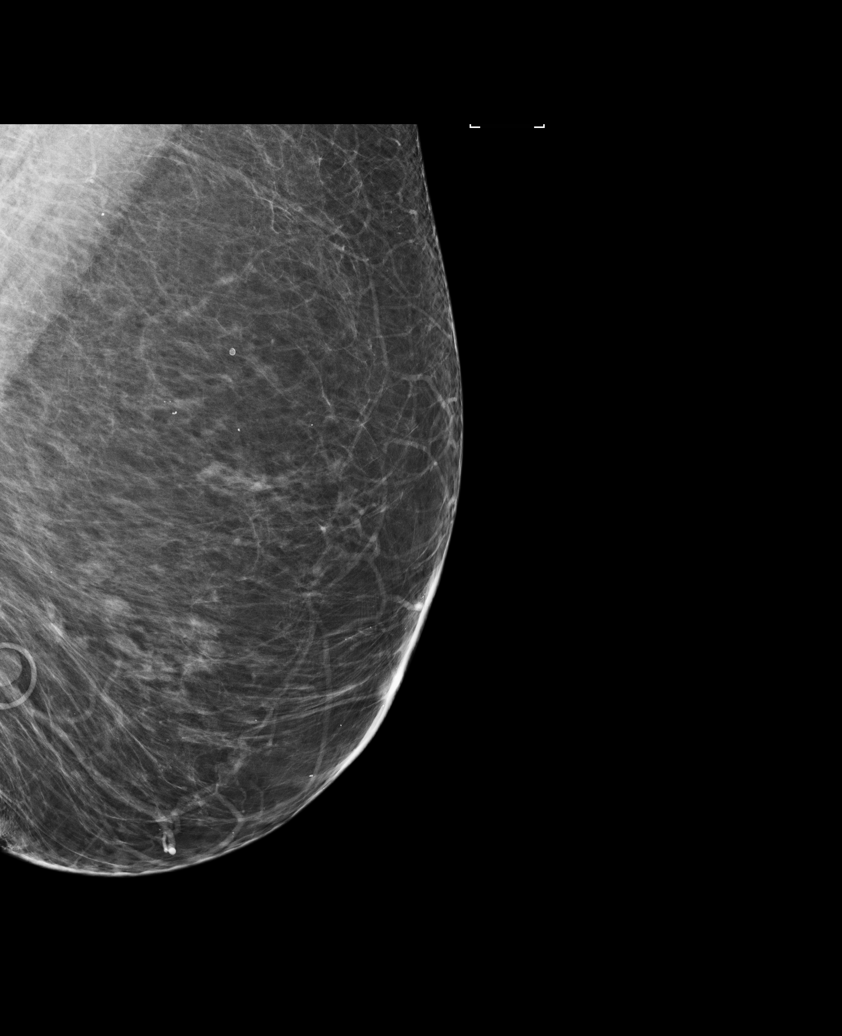

[6 of 6 positions shown; findings below may reference images not displayed]

ACR Breast Density Category b: There are scattered areas of
fibroglandular density.
FINDINGS: There are no findings suspicious for malignancy. Images were
processed with CAD.
IMPRESSION: No mammographic evidence of malignancy. A result letter of this
screening mammogram will be mailed directly to the patient.

RECOMMENDATION:
Screening mammogram in one year. (Code:[id number])

BI-RADS CATEGORY  1: Negative.

## 2021-12-14 DIAGNOSIS — M47812 Spondylosis without myelopathy or radiculopathy, cervical region: Secondary | ICD-10-CM | POA: Diagnosis not present

## 2021-12-14 DIAGNOSIS — M542 Cervicalgia: Secondary | ICD-10-CM | POA: Diagnosis not present

## 2022-01-06 DIAGNOSIS — M47812 Spondylosis without myelopathy or radiculopathy, cervical region: Secondary | ICD-10-CM | POA: Diagnosis not present

## 2022-01-20 DIAGNOSIS — M47812 Spondylosis without myelopathy or radiculopathy, cervical region: Secondary | ICD-10-CM | POA: Diagnosis not present

## 2022-02-15 ENCOUNTER — Other Ambulatory Visit: Payer: Self-pay | Admitting: Neurological Surgery

## 2022-02-15 DIAGNOSIS — M542 Cervicalgia: Secondary | ICD-10-CM | POA: Diagnosis not present

## 2022-02-18 ENCOUNTER — Ambulatory Visit
Admission: RE | Admit: 2022-02-18 | Discharge: 2022-02-18 | Disposition: A | Discharge status: Home / Self Care | Payer: Medicare Other | Source: Ambulatory Visit | Attending: Neurological Surgery | Admitting: Neurological Surgery

## 2022-02-18 DIAGNOSIS — M47812 Spondylosis without myelopathy or radiculopathy, cervical region: Secondary | ICD-10-CM | POA: Diagnosis not present

## 2022-02-18 DIAGNOSIS — M542 Cervicalgia: Secondary | ICD-10-CM

## 2022-02-18 DIAGNOSIS — M4802 Spinal stenosis, cervical region: Secondary | ICD-10-CM | POA: Diagnosis not present

## 2022-02-18 DIAGNOSIS — M2578 Osteophyte, vertebrae: Secondary | ICD-10-CM | POA: Diagnosis not present

## 2022-03-09 DIAGNOSIS — D3131 Benign neoplasm of right choroid: Secondary | ICD-10-CM | POA: Diagnosis not present

## 2022-03-10 DIAGNOSIS — M542 Cervicalgia: Secondary | ICD-10-CM | POA: Diagnosis not present

## 2022-03-16 DIAGNOSIS — M47812 Spondylosis without myelopathy or radiculopathy, cervical region: Secondary | ICD-10-CM | POA: Diagnosis not present

## 2022-04-04 HISTORY — PX: COLONOSCOPY: SHX174

## 2022-04-27 ENCOUNTER — Other Ambulatory Visit: Payer: Self-pay | Admitting: Neurological Surgery

## 2022-05-03 NOTE — Progress Notes (Signed)
Surgical Instructions    Your procedure is scheduled on Wednesday February 7th.  Report to [facility] Main Entrance "A" at 9 A.M., then check in with the Admitting office.  Call this number if you have problems the morning of surgery:  [phone]   If you have any questions prior to your surgery date call [phone]: Open Monday-Friday 8am-4pm If you experience any cold or flu symptoms such as cough, fever, chills, shortness of breath, etc. between now and your scheduled surgery, please notify Margaret Horn at the above number     Remember:  Do not eat or drink after midnight the night before your surgery     Take these medicines the morning of surgery with A SIP OF WATER: atenolol (TENORMIN) 50 MG tablet  atorvastatin (LIPITOR) 20 MG tablet  Potassium 99 MG TABS    As of today, STOP taking any Aspirin (unless otherwise instructed by your surgeon) Aleve, Naproxen, Ibuprofen, Motrin, Advil, Goody's, BC's, all herbal medications, fish oil, and all vitamins.           Do not wear jewelry or makeup. Do not wear lotions, powders, perfumes or deodorant. Do not shave 48 hours prior to surgery.   Do not bring valuables to the hospital. Do not wear nail polish, gel polish, artificial nails, or any other type of covering on natural nails (fingers and toes) If you have artificial nails or gel coating that need to be removed by a nail salon, please have this removed prior to surgery. Artificial nails or gel coating may interfere with anesthesia's ability to adequately monitor your vital signs.  [employer] is not responsible for any belongings or valuables.    Do NOT Smoke (Tobacco/Vaping)  24 hours prior to your procedure  If you use a CPAP at night, you may bring your mask for your overnight stay.   Contacts, glasses, hearing aids, dentures or partials may not be worn into surgery, please bring cases for these belongings   For patients admitted to the hospital, discharge time will be  determined by your treatment team.   Patients discharged the day of surgery will not be allowed to drive home, and someone needs to stay with them for 24 hours.   SURGICAL WAITING ROOM VISITATION Patients having surgery or a procedure may have no more than 2 support people in the waiting area - these visitors may rotate.   Children under the age of 84 must have an adult with them who is not the patient. If the patient needs to stay at the hospital during part of their recovery, the visitor guidelines for inpatient rooms apply. Pre-op nurse will coordinate an appropriate time for 1 support person to accompany patient in pre-op.  This support person may not rotate.   Please refer to [email or web address] for the visitor guidelines for Inpatients (after your surgery is over and you are in a regular room).    Special instructions:    Oral Hygiene is also important to reduce your risk of infection.  Remember - BRUSH YOUR TEETH THE MORNING OF SURGERY WITH YOUR REGULAR TOOTHPASTE   [place]- Preparing For Surgery  Before surgery, you can play an important role. Because skin is not sterile, your skin needs to be as free of germs as possible. You can reduce the number of germs on your skin by washing with CHG (chlorahexidine gluconate) Soap before surgery.  CHG is an antiseptic cleaner which kills germs and bonds with the skin to continue killing germs even  after washing.     Please do not use if you have an allergy to CHG or antibacterial soaps. If your skin becomes reddened/irritated stop using the CHG.  Do not shave (including legs and underarms) for at least 48 hours prior to first CHG shower. It is OK to shave your face.  Please follow these instructions carefully.     Shower the NIGHT BEFORE SURGERY and the MORNING OF SURGERY with CHG Soap.   If you chose to wash your hair, wash your hair first as usual with your normal shampoo. After you  shampoo, rinse your hair and body thoroughly to remove the shampoo.  Then [employer] and genitals (private parts) with your normal soap and rinse thoroughly to remove soap.  After that Use CHG Soap as you would any other liquid soap. You can apply CHG directly to the skin and wash gently with a scrungie or a clean washcloth.   Apply the CHG Soap to your body ONLY FROM THE NECK DOWN.  Do not use on open wounds or open sores. Avoid contact with your eyes, ears, mouth and genitals (private parts). Wash Face and genitals (private parts)  with your normal soap.   Wash thoroughly, paying special attention to the area where your surgery will be performed.  Thoroughly rinse your body with warm water from the neck down.  DO NOT shower/wash with your normal soap after using and rinsing off the CHG Soap.  Pat yourself dry with a CLEAN TOWEL.  Wear CLEAN PAJAMAS to bed the night before surgery  Place CLEAN SHEETS on your bed the night before your surgery  DO NOT SLEEP WITH PETS.   Day of Surgery:  Take a shower with CHG soap. Wear Clean/Comfortable clothing the morning of surgery Do not apply any deodorants/lotions.   Remember to brush your teeth WITH YOUR REGULAR TOOTHPASTE.    If you received a COVID test during your pre-op visit, it is requested that you wear a mask when out in public, stay away from anyone that may not be feeling well, and notify your surgeon if you develop symptoms. If you have been in contact with anyone that has tested positive in the last 10 days, please notify your surgeon.    Please read over the following fact sheets that you were given.

## 2022-05-04 ENCOUNTER — Other Ambulatory Visit: Payer: Self-pay

## 2022-05-04 ENCOUNTER — Encounter (HOSPITAL_COMMUNITY)
Admission: RE | Admit: 2022-05-04 | Discharge: 2022-05-04 | Disposition: A | Discharge status: Home / Self Care | Payer: Medicare Other | Source: Ambulatory Visit | Attending: Neurological Surgery | Admitting: Neurological Surgery

## 2022-05-04 ENCOUNTER — Encounter (HOSPITAL_COMMUNITY): Payer: Self-pay

## 2022-05-04 VITALS — BP 148/78 | HR 64 | Temp 97.7°F | Resp 18 | Ht 65.0 in | Wt 169.8 lb

## 2022-05-04 DIAGNOSIS — I10 Essential (primary) hypertension: Secondary | ICD-10-CM | POA: Diagnosis not present

## 2022-05-04 DIAGNOSIS — Z01818 Encounter for other preprocedural examination: Secondary | ICD-10-CM | POA: Insufficient documentation

## 2022-05-04 HISTORY — DX: Anxiety disorder, unspecified: F41.9

## 2022-05-04 HISTORY — DX: Prediabetes: R73.03

## 2022-05-04 HISTORY — DX: Unspecified osteoarthritis, unspecified site: M19.90

## 2022-05-04 HISTORY — DX: Essential (primary) hypertension: I10

## 2022-05-04 HISTORY — DX: Depression, unspecified: F32.A

## 2022-05-04 LAB — SURGICAL PCR SCREEN
MRSA, PCR: NEGATIVE
Staphylococcus aureus: NEGATIVE

## 2022-05-04 LAB — BASIC METABOLIC PANEL
Anion gap: 8 (ref 5–15)
BUN: 15 mg/dL (ref 8–23)
CO2: 28 mmol/L (ref 22–32)
Calcium: 9.7 mg/dL (ref 8.9–10.3)
Chloride: 101 mmol/L (ref 98–111)
Creatinine, Ser: 0.66 mg/dL (ref 0.44–1.00)
GFR, Estimated: >60 mL/min (ref >60)
Glucose, Bld: 123 mg/dL — ABNORMAL HIGH (ref 70–99)
Potassium: 4 mmol/L (ref 3.5–5.1)
Sodium: 137 mmol/L (ref 135–145)

## 2022-05-04 LAB — TYPE AND SCREEN
ABO/RH(D): O POS
Antibody Screen: NEGATIVE

## 2022-05-04 LAB — CBC
HCT: 43.7 % (ref 36.0–46.0)
Hemoglobin: 14.7 g/dL (ref 12.0–15.0)
MCH: 32.1 pg (ref 26.0–34.0)
MCHC: 33.6 g/dL (ref 30.0–36.0)
MCV: 95.4 fL (ref 80.0–100.0)
Platelets: 190 10*3/uL (ref 150–400)
RBC: 4.58 MIL/uL (ref 3.87–5.11)
RDW: 13.3 % (ref 11.5–15.5)
WBC: 6.2 10*3/uL (ref 4.0–10.5)
nRBC: 0 % (ref 0.0–0.2)

## 2022-05-04 LAB — PROTIME-INR
INR: 1 (ref 0.8–1.2)
Prothrombin Time: 12.9 seconds (ref 11.4–15.2)

## 2022-05-04 NOTE — Progress Notes (Addendum)
PCP - Dr.Cynthia White Cardiologist - pt denies  PPM/ICD - pt denies Device Orders - n/a Rep Notified - n/a  Chest x-ray - n/a EKG - 05/04/22 Stress Test - pt denies ECHO - pt denies Cardiac Cath - pt denies  Sleep Study - pt denies CPAP - n/a  Fasting Blood Sugar - Pre-Diabetic- pt does not check Blood sugar at home Checks Blood Sugar _____ times a day  Last dose of GLP1 agonist-  pt denies GLP1 instructions: n/a  Blood Thinner Instructions:pt denies Aspirin Instructions:pt denies  ERAS Protcol -NPO after Midnight  PRE-SURGERY Ensure or G2- none ordered   COVID TEST- n/a   Anesthesia review: YES, abnormal EKG.   Patient denies shortness of breath, fever, cough and chest pain at PAT appointment   All instructions explained to the patient, with a verbal understanding of the material. Patient agrees to go over the instructions while at home for a better understanding. Patient also instructed to self quarantine after being tested for COVID-19. The opportunity to ask questions was provided.

## 2022-05-10 NOTE — Progress Notes (Signed)
Spoke with the pt to arrive tom at 0830.

## 2022-05-11 ENCOUNTER — Inpatient Hospital Stay (HOSPITAL_COMMUNITY): Payer: Medicare Other | Admitting: Physician Assistant

## 2022-05-11 ENCOUNTER — Other Ambulatory Visit: Payer: Self-pay

## 2022-05-11 ENCOUNTER — Encounter (HOSPITAL_COMMUNITY)
Admission: RE | Disposition: A | Discharge status: Home / Self Care | Payer: Self-pay | Source: Ambulatory Visit | Attending: Neurological Surgery

## 2022-05-11 ENCOUNTER — Encounter (HOSPITAL_COMMUNITY): Payer: Self-pay | Admitting: Neurological Surgery

## 2022-05-11 ENCOUNTER — Inpatient Hospital Stay (HOSPITAL_COMMUNITY): Payer: Medicare Other

## 2022-05-11 ENCOUNTER — Inpatient Hospital Stay (HOSPITAL_COMMUNITY)
Admission: RE | Admit: 2022-05-11 | Discharge: 2022-05-12 | DRG: 473 | Disposition: A | Discharge status: Home / Self Care | Payer: Medicare Other | Source: Ambulatory Visit | Attending: Neurological Surgery | Admitting: Neurological Surgery

## 2022-05-11 DIAGNOSIS — M4802 Spinal stenosis, cervical region: Secondary | ICD-10-CM | POA: Diagnosis present

## 2022-05-11 DIAGNOSIS — Z79899 Other long term (current) drug therapy: Secondary | ICD-10-CM | POA: Diagnosis not present

## 2022-05-11 DIAGNOSIS — Z91040 Latex allergy status: Secondary | ICD-10-CM | POA: Diagnosis not present

## 2022-05-11 DIAGNOSIS — I10 Essential (primary) hypertension: Secondary | ICD-10-CM | POA: Diagnosis present

## 2022-05-11 DIAGNOSIS — M5021 Other cervical disc displacement,  high cervical region: Secondary | ICD-10-CM | POA: Diagnosis present

## 2022-05-11 DIAGNOSIS — Z888 Allergy status to other drugs, medicaments and biological substances status: Secondary | ICD-10-CM

## 2022-05-11 DIAGNOSIS — F418 Other specified anxiety disorders: Secondary | ICD-10-CM

## 2022-05-11 DIAGNOSIS — Z981 Arthrodesis status: Principal | ICD-10-CM

## 2022-05-11 DIAGNOSIS — Z886 Allergy status to analgesic agent status: Secondary | ICD-10-CM | POA: Diagnosis not present

## 2022-05-11 DIAGNOSIS — R7303 Prediabetes: Secondary | ICD-10-CM | POA: Diagnosis present

## 2022-05-11 DIAGNOSIS — M47812 Spondylosis without myelopathy or radiculopathy, cervical region: Principal | ICD-10-CM | POA: Diagnosis present

## 2022-05-11 DIAGNOSIS — Z885 Allergy status to narcotic agent status: Secondary | ICD-10-CM

## 2022-05-11 DIAGNOSIS — M542 Cervicalgia: Secondary | ICD-10-CM

## 2022-05-11 DIAGNOSIS — Z881 Allergy status to other antibiotic agents status: Secondary | ICD-10-CM | POA: Diagnosis not present

## 2022-05-11 HISTORY — PX: POSTERIOR CERVICAL FUSION/FORAMINOTOMY: SHX5038

## 2022-05-11 LAB — ABO/RH: ABO/RH(D): O POS

## 2022-05-11 SURGERY — POSTERIOR CERVICAL FUSION/FORAMINOTOMY LEVEL 3
Anesthesia: General

## 2022-05-11 MED ORDER — BUPIVACAINE HCL (PF) 0.25 % IJ SOLN
INTRAMUSCULAR | Status: AC
  Filled 2022-05-11: qty 30

## 2022-05-11 MED ORDER — 0.9 % SODIUM CHLORIDE (POUR BTL) OPTIME
TOPICAL | Status: DC | PRN
  Administered 2022-05-11: 1000 mL

## 2022-05-11 MED ORDER — ZINC SULFATE 220 (50 ZN) MG PO CAPS
220.0000 mg | ORAL_CAPSULE | Freq: Every day | ORAL | Status: DC
  Administered 2022-05-11: 220 mg via ORAL
  Filled 2022-05-11 (×2): qty 1

## 2022-05-11 MED ORDER — POTASSIUM CHLORIDE IN NACL 20-0.9 MEQ/L-% IV SOLN: INTRAVENOUS | Status: DC

## 2022-05-11 MED ORDER — ORAL CARE MOUTH RINSE: 15.0000 mL | Freq: Once | OROMUCOSAL | Status: AC

## 2022-05-11 MED ORDER — CEFAZOLIN SODIUM-DEXTROSE 2-4 GM/100ML-% IV SOLN
2.0000 g | Freq: Three times a day (TID) | INTRAVENOUS | Status: AC
  Administered 2022-05-11 – 2022-05-12 (×2): 2 g via INTRAVENOUS
  Filled 2022-05-11 (×2): qty 100

## 2022-05-11 MED ORDER — CELECOXIB 200 MG PO CAPS
200.0000 mg | ORAL_CAPSULE | Freq: Two times a day (BID) | ORAL | Status: DC
  Administered 2022-05-11: 200 mg via ORAL
  Filled 2022-05-11: qty 1

## 2022-05-11 MED ORDER — BUPIVACAINE HCL (PF) 0.25 % IJ SOLN
INTRAMUSCULAR | Status: DC | PRN
  Administered 2022-05-11: 10 mL

## 2022-05-11 MED ORDER — PROPOFOL 10 MG/ML IV BOLUS
INTRAVENOUS | Status: DC | PRN
  Administered 2022-05-11: 80 mg via INTRAVENOUS
  Administered 2022-05-11: 120 mg via INTRAVENOUS

## 2022-05-11 MED ORDER — ONDANSETRON HCL 4 MG/2ML IJ SOLN
INTRAMUSCULAR | Status: AC
  Filled 2022-05-11: qty 2

## 2022-05-11 MED ORDER — PHENYLEPHRINE 80 MCG/ML (10ML) SYRINGE FOR IV PUSH (FOR BLOOD PRESSURE SUPPORT)
PREFILLED_SYRINGE | INTRAVENOUS | Status: AC
  Filled 2022-05-11: qty 20

## 2022-05-11 MED ORDER — DEXAMETHASONE SODIUM PHOSPHATE 10 MG/ML IJ SOLN
INTRAMUSCULAR | Status: DC | PRN
  Administered 2022-05-11: 10 mg via INTRAVENOUS

## 2022-05-11 MED ORDER — FENTANYL CITRATE (PF) 100 MCG/2ML IJ SOLN
25.0000 ug | INTRAMUSCULAR | Status: DC | PRN
  Administered 2022-05-11: 25 ug via INTRAVENOUS

## 2022-05-11 MED ORDER — MORPHINE SULFATE (PF) 2 MG/ML IV SOLN
2.0000 mg | INTRAVENOUS | Status: DC | PRN
  Administered 2022-05-11: 2 mg via INTRAVENOUS
  Filled 2022-05-11: qty 1

## 2022-05-11 MED ORDER — THROMBIN 5000 UNITS EX SOLR
CUTANEOUS | Status: AC
  Filled 2022-05-11: qty 5000

## 2022-05-11 MED ORDER — POTASSIUM CHLORIDE CRYS ER 20 MEQ PO TBCR: 20.0000 meq | EXTENDED_RELEASE_TABLET | Freq: Every day | ORAL | Status: DC

## 2022-05-11 MED ORDER — MIDAZOLAM HCL 2 MG/2ML IJ SOLN
INTRAMUSCULAR | Status: DC | PRN
  Administered 2022-05-11: 2 mg via INTRAVENOUS

## 2022-05-11 MED ORDER — ROCURONIUM BROMIDE 10 MG/ML (PF) SYRINGE
PREFILLED_SYRINGE | INTRAVENOUS | Status: AC
  Filled 2022-05-11: qty 20

## 2022-05-11 MED ORDER — PHENYLEPHRINE HCL-NACL 20-0.9 MG/250ML-% IV SOLN: INTRAVENOUS | Status: DC | PRN

## 2022-05-11 MED ORDER — EPHEDRINE 5 MG/ML INJ
INTRAVENOUS | Status: AC
  Filled 2022-05-11: qty 10

## 2022-05-11 MED ORDER — OXYCODONE HCL 5 MG PO TABS
5.0000 mg | ORAL_TABLET | ORAL | Status: DC | PRN
  Administered 2022-05-11 – 2022-05-12 (×3): 5 mg via ORAL
  Filled 2022-05-11 (×3): qty 1

## 2022-05-11 MED ORDER — HYDROCHLOROTHIAZIDE 25 MG PO TABS
25.0000 mg | ORAL_TABLET | Freq: Every day | ORAL | Status: DC
  Administered 2022-05-11: 25 mg via ORAL
  Filled 2022-05-11: qty 1

## 2022-05-11 MED ORDER — PROPOFOL 10 MG/ML IV BOLUS
INTRAVENOUS | Status: AC
  Filled 2022-05-11: qty 20

## 2022-05-11 MED ORDER — SODIUM CHLORIDE 0.9% FLUSH: 3.0000 mL | INTRAVENOUS | Status: DC | PRN

## 2022-05-11 MED ORDER — GABAPENTIN 300 MG PO CAPS
300.0000 mg | ORAL_CAPSULE | ORAL | Status: AC
  Administered 2022-05-11: 300 mg via ORAL
  Filled 2022-05-11: qty 1

## 2022-05-11 MED ORDER — FENTANYL CITRATE (PF) 250 MCG/5ML IJ SOLN
INTRAMUSCULAR | Status: AC
  Filled 2022-05-11: qty 5

## 2022-05-11 MED ORDER — ONDANSETRON HCL 4 MG/2ML IJ SOLN: 4.0000 mg | Freq: Four times a day (QID) | INTRAMUSCULAR | Status: DC | PRN

## 2022-05-11 MED ORDER — PHENOL 1.4 % MT LIQD: 1.0000 | OROMUCOSAL | Status: DC | PRN

## 2022-05-11 MED ORDER — AMLODIPINE BESYLATE 5 MG PO TABS
5.0000 mg | ORAL_TABLET | Freq: Every day | ORAL | Status: DC
  Administered 2022-05-11: 5 mg via ORAL
  Filled 2022-05-11: qty 1

## 2022-05-11 MED ORDER — ACETYLCYSTEINE 600 MG PO CAPS: 600.0000 mg | ORAL_CAPSULE | Freq: Every day | ORAL | Status: DC

## 2022-05-11 MED ORDER — ACETAMINOPHEN 500 MG PO TABS
1000.0000 mg | ORAL_TABLET | ORAL | Status: AC
  Administered 2022-05-11: 1000 mg via ORAL
  Filled 2022-05-11: qty 2

## 2022-05-11 MED ORDER — SUGAMMADEX SODIUM 200 MG/2ML IV SOLN
INTRAVENOUS | Status: DC | PRN
  Administered 2022-05-11: 300 mg via INTRAVENOUS

## 2022-05-11 MED ORDER — THROMBIN 20000 UNITS EX SOLR
CUTANEOUS | Status: DC | PRN
  Administered 2022-05-11: 20 mL via TOPICAL

## 2022-05-11 MED ORDER — VITAMIN C 500 MG PO TABS: 500.0000 mg | ORAL_TABLET | Freq: Every day | ORAL | Status: DC

## 2022-05-11 MED ORDER — ROCURONIUM BROMIDE 10 MG/ML (PF) SYRINGE
PREFILLED_SYRINGE | INTRAVENOUS | Status: DC | PRN
  Administered 2022-05-11: 10 mg via INTRAVENOUS
  Administered 2022-05-11: 20 mg via INTRAVENOUS
  Administered 2022-05-11: 60 mg via INTRAVENOUS
  Administered 2022-05-11: 20 mg via INTRAVENOUS

## 2022-05-11 MED ORDER — THROMBIN 20000 UNITS EX SOLR
CUTANEOUS | Status: AC
  Filled 2022-05-11: qty 20000

## 2022-05-11 MED ORDER — IRBESARTAN 150 MG PO TABS
300.0000 mg | ORAL_TABLET | Freq: Every day | ORAL | Status: DC
  Administered 2022-05-11: 300 mg via ORAL
  Filled 2022-05-11: qty 2

## 2022-05-11 MED ORDER — LIDOCAINE 2% (20 MG/ML) 5 ML SYRINGE
INTRAMUSCULAR | Status: AC
  Filled 2022-05-11: qty 10

## 2022-05-11 MED ORDER — CEFAZOLIN SODIUM-DEXTROSE 2-4 GM/100ML-% IV SOLN
2.0000 g | INTRAVENOUS | Status: AC
  Administered 2022-05-11: 2 g via INTRAVENOUS
  Filled 2022-05-11: qty 100

## 2022-05-11 MED ORDER — METHOCARBAMOL 500 MG PO TABS
500.0000 mg | ORAL_TABLET | Freq: Four times a day (QID) | ORAL | Status: DC | PRN
  Administered 2022-05-11 – 2022-05-12 (×2): 500 mg via ORAL
  Filled 2022-05-11 (×2): qty 1

## 2022-05-11 MED ORDER — THROMBIN 5000 UNITS EX SOLR
OROMUCOSAL | Status: DC | PRN
  Administered 2022-05-11: 5 mL via TOPICAL

## 2022-05-11 MED ORDER — MIDAZOLAM HCL 2 MG/2ML IJ SOLN
INTRAMUSCULAR | Status: AC
  Filled 2022-05-11: qty 2

## 2022-05-11 MED ORDER — ATENOLOL 50 MG PO TABS: 50.0000 mg | ORAL_TABLET | Freq: Every day | ORAL | Status: DC

## 2022-05-11 MED ORDER — ONDANSETRON HCL 4 MG/2ML IJ SOLN
INTRAMUSCULAR | Status: DC | PRN
  Administered 2022-05-11: 4 mg via INTRAVENOUS

## 2022-05-11 MED ORDER — CHLORHEXIDINE GLUCONATE CLOTH 2 % EX PADS: 6.0000 | MEDICATED_PAD | Freq: Once | CUTANEOUS | Status: DC

## 2022-05-11 MED ORDER — BACITRACIN ZINC 500 UNIT/GM EX OINT
TOPICAL_OINTMENT | CUTANEOUS | Status: AC
  Filled 2022-05-11: qty 28.35

## 2022-05-11 MED ORDER — CHLORHEXIDINE GLUCONATE 0.12 % MT SOLN
15.0000 mL | Freq: Once | OROMUCOSAL | Status: AC
  Administered 2022-05-11: 15 mL via OROMUCOSAL
  Filled 2022-05-11: qty 15

## 2022-05-11 MED ORDER — ONDANSETRON HCL 4 MG/2ML IJ SOLN: 4.0000 mg | Freq: Once | INTRAMUSCULAR | Status: DC | PRN

## 2022-05-11 MED ORDER — LIDOCAINE 2% (20 MG/ML) 5 ML SYRINGE
INTRAMUSCULAR | Status: DC | PRN
  Administered 2022-05-11: 30 mg via INTRAVENOUS

## 2022-05-11 MED ORDER — DEXAMETHASONE SODIUM PHOSPHATE 10 MG/ML IJ SOLN
INTRAMUSCULAR | Status: AC
  Filled 2022-05-11: qty 1

## 2022-05-11 MED ORDER — POTASSIUM 99 MG PO TABS: 99.0000 mg | ORAL_TABLET | Freq: Every day | ORAL | Status: DC

## 2022-05-11 MED ORDER — METHOCARBAMOL 1000 MG/10ML IJ SOLN: 500.0000 mg | Freq: Four times a day (QID) | INTRAVENOUS | Status: DC | PRN

## 2022-05-11 MED ORDER — ADULT MULTIVITAMIN W/MINERALS CH: 1.0000 | ORAL_TABLET | Freq: Every day | ORAL | Status: DC

## 2022-05-11 MED ORDER — SENNA 8.6 MG PO TABS
1.0000 | ORAL_TABLET | Freq: Two times a day (BID) | ORAL | Status: DC
  Administered 2022-05-11: 8.6 mg via ORAL
  Filled 2022-05-11: qty 1

## 2022-05-11 MED ORDER — FENTANYL CITRATE (PF) 100 MCG/2ML IJ SOLN
INTRAMUSCULAR | Status: AC
  Administered 2022-05-11: 50 ug via INTRAVENOUS
  Filled 2022-05-11: qty 2

## 2022-05-11 MED ORDER — LACTATED RINGERS IV SOLN: INTRAVENOUS | Status: DC

## 2022-05-11 MED ORDER — OLMESARTAN-AMLODIPINE-HCTZ 40-5-25 MG PO TABS: 1.0000 | ORAL_TABLET | Freq: Every day | ORAL | Status: DC

## 2022-05-11 MED ORDER — MENTHOL 3 MG MT LOZG: 1.0000 | LOZENGE | OROMUCOSAL | Status: DC | PRN

## 2022-05-11 MED ORDER — FENTANYL CITRATE (PF) 250 MCG/5ML IJ SOLN
INTRAMUSCULAR | Status: DC | PRN
  Administered 2022-05-11 (×3): 100 ug via INTRAVENOUS
  Administered 2022-05-11: 50 ug via INTRAVENOUS

## 2022-05-11 MED ORDER — BACITRACIN ZINC 500 UNIT/GM EX OINT
TOPICAL_OINTMENT | CUTANEOUS | Status: DC | PRN
  Administered 2022-05-11: 1 via TOPICAL

## 2022-05-11 MED ORDER — SODIUM CHLORIDE 0.9 % IV SOLN
250.0000 mL | INTRAVENOUS | Status: DC
  Administered 2022-05-11: 250 mL via INTRAVENOUS

## 2022-05-11 MED ORDER — ACETAMINOPHEN 500 MG PO TABS
1000.0000 mg | ORAL_TABLET | Freq: Four times a day (QID) | ORAL | Status: DC
  Administered 2022-05-11 – 2022-05-12 (×3): 1000 mg via ORAL
  Filled 2022-05-11 (×3): qty 2

## 2022-05-11 MED ORDER — SODIUM CHLORIDE 0.9% FLUSH: 3.0000 mL | Freq: Two times a day (BID) | INTRAVENOUS | Status: DC

## 2022-05-11 MED ORDER — VANCOMYCIN HCL 1000 MG IV SOLR
INTRAVENOUS | Status: AC
  Filled 2022-05-11: qty 20

## 2022-05-11 MED ORDER — ONDANSETRON HCL 4 MG PO TABS: 4.0000 mg | ORAL_TABLET | Freq: Four times a day (QID) | ORAL | Status: DC | PRN

## 2022-05-11 SURGICAL SUPPLY — 62 items
BAG COUNTER SPONGE SURGICOUNT (BAG) ×1 IMPLANT
BAND RUBBER #18 3X1/16 STRL (MISCELLANEOUS) IMPLANT
BENZOIN TINCTURE PRP APPL 2/3 (GAUZE/BANDAGES/DRESSINGS) ×2 IMPLANT
BIT DRILL INVICTUS SUB 2.1 STR (BIT) IMPLANT
BLADE CLIPPER SURG (BLADE) IMPLANT
BONE FIBERS PLIAFX 10 (Bone Implant) ×1 IMPLANT
BUR CARBIDE MATCH 3.0 (BURR) IMPLANT
BUR PRESCISION 1.7 ELITE (BURR) IMPLANT
CANISTER SUCT 3000ML PPV (MISCELLANEOUS) ×1 IMPLANT
DERMABOND ADVANCED .7 DNX12 (GAUZE/BANDAGES/DRESSINGS) IMPLANT
DRAPE C-ARM 42X72 X-RAY (DRAPES) ×2 IMPLANT
DRAPE LAPAROTOMY 100X72 PEDS (DRAPES) ×1 IMPLANT
DRAPE MICROSCOPE SLANT 54X150 (MISCELLANEOUS) IMPLANT
DRSG OPSITE POSTOP 4X6 (GAUZE/BANDAGES/DRESSINGS) IMPLANT
DURAPREP 26ML APPLICATOR (WOUND CARE) ×1 IMPLANT
ELECT REM PT RETURN 9FT ADLT (ELECTROSURGICAL) ×1
ELECTRODE REM PT RTRN 9FT ADLT (ELECTROSURGICAL) ×1 IMPLANT
EVACUATOR 1/8 PVC DRAIN (DRAIN) IMPLANT
GAUZE 4X4 16PLY ~~LOC~~+RFID DBL (SPONGE) IMPLANT
GAUZE SPONGE 4X4 12PLY STRL (GAUZE/BANDAGES/DRESSINGS) ×1 IMPLANT
GLOVE BIO SURGEON STRL SZ7 (GLOVE) IMPLANT
GLOVE BIO SURGEON STRL SZ8 (GLOVE) ×1 IMPLANT
GLOVE BIOGEL PI IND STRL 7.0 (GLOVE) IMPLANT
GLOVE BIOGEL PI IND STRL 7.5 (GLOVE) IMPLANT
GLOVE SURG SS PI 7.0 STRL IVOR (GLOVE) IMPLANT
GLOVE SURG SS PI 8.0 STRL IVOR (GLOVE) IMPLANT
GOWN STRL REUS W/ TWL LRG LVL3 (GOWN DISPOSABLE) IMPLANT
GOWN STRL REUS W/ TWL XL LVL3 (GOWN DISPOSABLE) ×1 IMPLANT
GOWN STRL REUS W/TWL 2XL LVL3 (GOWN DISPOSABLE) IMPLANT
GOWN STRL REUS W/TWL LRG LVL3 (GOWN DISPOSABLE) ×1
GOWN STRL REUS W/TWL XL LVL3 (GOWN DISPOSABLE) ×1
GRAFT BNE FBR PLIAFX PRIME 10 (Bone Implant) IMPLANT
GRAFT BONE PROTEIOS LRG 5CC (Orthopedic Implant) IMPLANT
HEMOSTAT POWDER KIT SURGIFOAM (HEMOSTASIS) IMPLANT
KIT BASIN OR (CUSTOM PROCEDURE TRAY) ×1 IMPLANT
KIT TURNOVER KIT B (KITS) ×1 IMPLANT
NDL SPNL 20GX3.5 QUINCKE YW (NEEDLE) IMPLANT
NEEDLE HYPO 22GX1.5 SAFETY (NEEDLE) ×1 IMPLANT
NEEDLE SPNL 20GX3.5 QUINCKE YW (NEEDLE) ×1 IMPLANT
NS IRRIG 1000ML POUR BTL (IV SOLUTION) ×1 IMPLANT
PACK LAMINECTOMY NEURO (CUSTOM PROCEDURE TRAY) ×1 IMPLANT
PAD ARMBOARD 7.5X6 YLW CONV (MISCELLANEOUS) ×1 IMPLANT
PIN MAYFIELD SKULL DISP (PIN) ×1 IMPLANT
ROD LORD TI 3.5X55 (Rod) IMPLANT
SCREW PA INVICTUS OCT 3.5X30 (Screw) IMPLANT
SCREW PA INVICTUS OCT 3.5X32 (Screw) IMPLANT
SCREW POLYAXIAL 3.5 X 14 (Screw) IMPLANT
SCREW SET ATEC (Screw) IMPLANT
SPONGE SURGIFOAM ABS GEL 100 (HEMOSTASIS) ×1 IMPLANT
SPONGE T-LAP 4X18 ~~LOC~~+RFID (SPONGE) IMPLANT
STRIP CLOSURE SKIN 1/2X4 (GAUZE/BANDAGES/DRESSINGS) ×1 IMPLANT
SUT PROLENE 0 CT 1 30 (SUTURE) IMPLANT
SUT VIC AB 0 CT1 18XCR BRD8 (SUTURE) ×1 IMPLANT
SUT VIC AB 0 CT1 8-18 (SUTURE) ×1
SUT VIC AB 2-0 CP2 18 (SUTURE) ×1 IMPLANT
SUT VIC AB 3-0 SH 8-18 (SUTURE) ×1 IMPLANT
TIP NONSTICK .5MMX23CM (INSTRUMENTS) ×1
TIP NONSTICK .5X23 (INSTRUMENTS) IMPLANT
TISSUE ILIAC TRICORT 2.2X45 (Bone Implant) IMPLANT
TOWEL GREEN STERILE (TOWEL DISPOSABLE) ×1 IMPLANT
TOWEL GREEN STERILE FF (TOWEL DISPOSABLE) ×1 IMPLANT
WATER STERILE IRR 1000ML POUR (IV SOLUTION) ×1 IMPLANT

## 2022-05-11 NOTE — Transfer of Care (Signed)
Immediate Anesthesia Transfer of Care Note  Patient: Margaret Horn  Procedure(s) Performed: Posterior cervical fusion with lateral mass fixation - Cervical one- Cervical four  Patient Location: PACU  Anesthesia Type:General  Level of Consciousness: awake and alert   Airway & Oxygen Therapy: Patient Spontanous Breathing and Patient connected to nasal cannula oxygen  Post-op Assessment: Report given to RN and Post -op Vital signs reviewed and stable  Post vital signs: Reviewed and stable  Last Vitals:  Vitals Value Taken Time  BP 185/130 05/11/22 1439  Temp    Pulse 69 05/11/22 1441  Resp 21 05/11/22 1441  SpO2 98 % 05/11/22 1441  Vitals shown include unvalidated device data.  Last Pain:  Vitals:   05/11/22 0901  TempSrc:   PainSc: 0-No pain         Complications: No notable events documented.

## 2022-05-11 NOTE — H&P (Signed)
Subjective:   Patient is a 72 y.o. female admitted for neck pain. The patient first presented to me with complaints of neck pain. Onset of symptoms was several months ago. The pain is described as aching, sharp, and stabbing and occurs all day. The pain is rated severe, and is located in the left neck and radiates to the left shoulder. The symptoms have been progressive. Symptoms are exacerbated by extending head backwards, and are relieved by none.  Previous work up includes MRI of cervical spine, results: cervical facet dz and DDD and CT of cervical spine, results: C1-2 facet dz.  Past Medical History:  Diagnosis Date   Anxiety    Arthritis    Depression    Hypertension    Pre-diabetes     Past Surgical History:  Procedure Laterality Date   CATARACT EXTRACTION W/ INTRAOCULAR LENS IMPLANT Left    CHOLECYSTECTOMY     HAND SURGERY Right    LAPAROSCOPIC TOTAL HYSTERECTOMY     REDUCTION MAMMAPLASTY Bilateral 1980's    Allergies  Allergen Reactions   Aspirin Other (See Comments)    GI Distress   Bisoprolol-Hydrochlorothiazide Other (See Comments)    Joint pain   Bupropion Other (See Comments)    Ineffective   Codeine Other (See Comments)    Hallucinations   Ezetimibe Diarrhea   Paxil [Paroxetine Hcl]     Weight gain   Sertraline Other (See Comments)    numbness   Zoster Vac Recomb Adjuvanted Other (See Comments)    Chest pain after 1st dose   Ciprofloxacin Rash   Latex Rash    Social History   Tobacco Use   Smoking status: Never   Smokeless tobacco: Never  Substance Use Topics   Alcohol use: Yes    Alcohol/week: 3.0 standard drinks of alcohol    Types: 3 Glasses of wine per week    History reviewed. No pertinent family history. Prior to Admission medications   Medication Sig Start Date End Date Taking? Authorizing Provider  Acetylcysteine (NAC) 600 MG CAPS Take 600 mg by mouth daily.   Yes [provider]  ascorbic acid (VITAMIN C) 500 MG tablet Take 500  mg by mouth daily.   Yes [provider]  atenolol (TENORMIN) 50 MG tablet Take 50 mg by mouth daily. 02/14/22  Yes [provider]  atorvastatin (LIPITOR) 20 MG tablet Take 20 mg by mouth daily. 02/11/22  Yes [provider]  B Complex-C (SUPER B COMPLEX PO) Take 1 tablet by mouth daily.   Yes [provider]  Cholecalciferol (VITAMIN D-3) 125 MCG (5000 UT) TABS Take 5,000 Units by mouth daily.   Yes [provider]  Coenzyme Q10 (CO Q10) 60 MG CAPS Take 60 mg by mouth daily.   Yes [provider]  COLLAGEN PO Take 2,000 mg by mouth daily.   Yes [provider]  Multiple Vitamin (MULTIVITAMIN WITH MINERALS) TABS tablet Take 1 tablet by mouth daily.   Yes [provider]  naproxen sodium (ALEVE) 220 MG tablet Take 220 mg by mouth in the morning.   Yes [provider]  Olmesartan-amLODIPine-HCTZ 40-5-25 MG TABS Take 1 tablet by mouth daily. 02/11/22  Yes [provider]  Potassium 99 MG TABS Take 99 mg by mouth daily.   Yes [provider]  pyridOXINE (VITAMIN B6) 100 MG tablet Take 100 mg by mouth daily.   Yes [provider]  QUERCETIN PO Take 1 capsule by mouth daily.   Yes  [provider]  zinc gluconate 50 MG tablet Take 50 mg by mouth daily.   Yes [provider]     Review of Systems  Positive ROS: neg  All other systems have been reviewed and were otherwise negative with the exception of those mentioned in the HPI and as above.  Objective: Vital signs in last 24 hours: Temp:  [98.5 F (36.9 C)] 98.5 F (36.9 C) (02/07 0832) Pulse Rate:  [63] 63 (02/07 0832) Resp:  [17] 17 (02/07 0832) BP: (178)/(85) 178/85 (02/07 0832) SpO2:  [98 %] 98 % (02/07 0832) Weight:  [76.2 kg] 76.2 kg (02/07 0832)  General Appearance: Alert, cooperative, no distress, appears stated age Head: Normocephalic, without obvious abnormality, atraumatic Eyes: PERRL,  conjunctiva/corneas clear, EOM's intact      Neck: Supple, symmetrical, trachea midline, Back: Symmetric, no curvature, ROM normal, no CVA tenderness Lungs:  respirations unlabored Heart: Regular rate and rhythm Abdomen: Soft, non-tender Extremities: Extremities normal, atraumatic, no cyanosis or edema Pulses: 2+ and symmetric all extremities Skin: Skin color, texture, turgor normal, no rashes or lesions  NEUROLOGIC:  Mental status: Alert and oriented x4, no aphasia, good attention span, fund of knowledge and memory  Motor Exam - grossly normal Sensory Exam - grossly normal Reflexes: 1+ Coordination - grossly normal Gait - grossly normal Balance - grossly normal Cranial Nerves: I: smell Not tested  II: visual acuity  OS: nl    OD: nl  II: visual fields Full to confrontation  II: pupils Equal, round, reactive to light  III,VII: ptosis None  III,IV,VI: extraocular muscles  Full ROM  V: mastication Normal  V: facial light touch sensation  Normal  V,VII: corneal reflex  Present  VII: facial muscle function - upper  Normal  VII: facial muscle function - lower Normal  VIII: hearing Not tested  IX: soft palate elevation  Normal  IX,X: gag reflex Present  XI: trapezius strength  5/5  XI: sternocleidomastoid strength 5/5  XI: neck flexion strength  5/5  XII: tongue strength  Normal    Data Review Lab Results  Component Value Date   WBC 6.2 05/04/2022   HGB 14.7 05/04/2022   HCT 43.7 05/04/2022   MCV 95.4 05/04/2022   PLT 190 05/04/2022   Lab Results  Component Value Date   NA 137 05/04/2022   K 4.0 05/04/2022   CL 101 05/04/2022   CO2 28 05/04/2022   BUN 15 05/04/2022   CREATININE 0.66 05/04/2022   GLUCOSE 123 (H) 05/04/2022   Lab Results  Component Value Date   INR 1.0 05/04/2022    Assessment:   Cervical 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 Estimated body mass index is 27.96 kg/m as calculated from the following:    Height as of this encounter: '5\' 5"'$  (1.651 m).   Weight as of this encounter: 76.2 kg.  Patient has failed conservative therapy. Planned surgery : posterior cervical instrumented arthrodesis C1-4  Plan:   I explained the condition and procedure to the patient and answered any questions.  Patient wishes to proceed with procedure as planned. Understands risks/ benefits/ and expected or typical outcomes.  Eustace Moore 05/11/2022 8:40 AM

## 2022-05-11 NOTE — Anesthesia Preprocedure Evaluation (Signed)
Anesthesia Evaluation  Patient identified by MRN, date of birth, ID band Patient awake    Reviewed: Allergy & Precautions, NPO status , Patient's Chart, lab work & pertinent test results, reviewed documented beta blocker date and time   History of Anesthesia Complications Negative for: history of anesthetic complications  Airway Mallampati: II  TM Distance: >3 FB Neck ROM: Limited    Dental  (+) Teeth Intact, Dental Advisory Given   Pulmonary neg pulmonary ROS   Pulmonary exam normal breath sounds clear to auscultation       Cardiovascular hypertension, Pt. on home beta blockers and Pt. on medications (-) angina (-) CAD and (-) Past MI Normal cardiovascular exam Rhythm:Regular Rate:Normal     Neuro/Psych  PSYCHIATRIC DISORDERS Anxiety Depression    Cervicalgia    GI/Hepatic negative GI ROS, Neg liver ROS,,,  Endo/Other  negative endocrine ROS    Renal/GU negative Renal ROS     Musculoskeletal  (+) Arthritis ,    Abdominal   Peds  Hematology negative hematology ROS (+)   Anesthesia Other Findings Day of surgery medications reviewed with the patient.  Reproductive/Obstetrics                             Anesthesia Physical Anesthesia Plan  ASA: 3  Anesthesia Plan: General   Post-op Pain Management: Tylenol PO (pre-op)*   Induction: Intravenous  PONV Risk Score and Plan: 3 and Dexamethasone and Ondansetron  Airway Management Planned: Oral ETT and Video Laryngoscope Planned  Additional Equipment: ClearSight  Intra-op Plan:   Post-operative Plan: Extubation in OR  Informed Consent: I have reviewed the patients History and Physical, chart, labs and discussed the procedure including the risks, benefits and alternatives for the proposed anesthesia with the patient or authorized representative who has indicated his/her understanding and acceptance.     Dental advisory given  Plan  Discussed with: CRNA  Anesthesia Plan Comments: (2nd PIV after induction )       Anesthesia Quick Evaluation

## 2022-05-11 NOTE — Progress Notes (Signed)
Orthopedic Tech Progress Note Patient Details:  Margaret Horn [facility] Oct 21, 1950 [id number]  Ortho Devices Type of Ortho Device: Soft collar Ortho Device/Splint Location: NECK Ortho Device/Splint Interventions: Ordered, Application   Post Interventions Patient Tolerated: Well Instructions Provided: [facility] [place] 05/11/2022, 3:47 PM

## 2022-05-11 NOTE — Anesthesia Procedure Notes (Signed)
Procedure Name: Intubation Date/Time: 05/11/2022 11:21 AM  Performed by: Eligha Bridegroom, CRNAPre-anesthesia Checklist: Patient identified, Emergency Drugs available, Suction available, Patient being monitored and Timeout performed Patient Re-evaluated:Patient Re-evaluated prior to induction Oxygen Delivery Method: Circle system utilized Preoxygenation: Pre-oxygenation with 100% oxygen Induction Type: IV induction Ventilation: Mask ventilation without difficulty and Oral airway inserted - appropriate to patient size Laryngoscope Size: Glidescope Grade View: Grade III Tube type: Oral Tube size: 7.0 mm Number of attempts: 1 Airway Equipment and Method: Stylet and Video-laryngoscopy Placement Confirmation: ETT inserted through vocal cords under direct vision, positive ETCO2 and breath sounds checked- equal and bilateral Secured at: 22 cm Tube secured with: Tape Dental Injury: Teeth and Oropharynx as per pre-operative assessment

## 2022-05-11 NOTE — Op Note (Signed)
05/11/2022  2:16 PM  PATIENT:  Margaret Horn  72 y.o. 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 Imaging showed cervical spondylosis with facet arthrosis C1-2 on the left and multilevel spondylosis. The patient tried conservative measures without relief. Pain was debilitating. Recommended posterior cervical arthrodesis C1-C4. Patient understood the risks, benefits, and alternatives and potential outcomes and wished to proceed.  PROCEDURE DETAILS: The patient was brought to the operating room. Generalized endotracheal anesthesia was induced. The patient was affixed a 3 point radiolucent [place] headrest and rolled into the prone position on chest rolls. All pressure points were padded. The posterior cervical region was cleaned and prepped with DuraPrep and then draped in the usual sterile fashion. 7 cc of local anesthesia was injected and a dorsal midline incision made in the posterior cervical region and carried down to the cervical fascia overlying the inferior occiput down to C4. The fascia was opened and the paraspinous musculature was taken down to expose the C1 ring, and C2-C3 and C4 bilaterally. Intraoperative fluoroscopy confirmed my level and then the dissection was carried out over the lateral facets. I localized the midpoint of each lateral mass of C3 and C4 and marked a region 1 mm medial to the midpoint of the lateral  mass, and then drilled in an upward and outward direction into the safe zone of each lateral mass. I drilled to a depth of 12 mm and then checked my drill hole with a ball probe. I then placed a 14 mm lateral mass screws into the safe zone of each lateral mass of C3 and C4 bilaterally until they were 2 fingers tight.  Did not place screws into C2 because her pars was fairly narrow we did not want to risk the vertebral artery.  Turned our attention to placement of the C1 lateral mass screws.  We used lateral fluoroscopy.  We dissected out over the C2 nerve roots and coagulated the epidural venous vasculature over the nerve roots and then dissected superior to the nerve roots to identify the lateral mass on each side.  We identified both the lateral edge and the medial edge of the lateral mass and then went to the midline at the junction with the posterior ring.  Utilizing lateral fluoroscopy we used a 15 mm 1.7 mm drill bit and drilled in an upward and slightly medial direction heading toward the anterior tubercle of C1.  We then palpated with a ball probe.  We tapped to a depth of 28 mm with the tap and then placed 30 mm C1 partially-threaded screws utilizing lateral fluoroscopy.    We then dissected between the C1 ring and the dura and passed a single 0 Prolene underneath the C1 ring.  We took a fibular strut graft and spent considerable time shape and this to fit between the ring of C1 and the top of C2.  We drilled a hole in the graft on each side and ran the 0 Prolene through the graft bilaterally  ranted around the C2 spinous process and tied the sure allograft down to the C1 ring and the top of C2 after decorticating the structures.  I then decorticated the lateral masses and the facet joints at C2-3 C3-4 and C4-5 and packed them with morcellized allograft to perform arthrodesis from C1 down to the top of C5. I then placed rods into the multiaxial screw heads of the screws and locked these into position  with the locking caps and anti-torque device. I then checked the final construct with AP/Lat fluoroscopy. I irrigated with saline solution. After hemostasis was achieved I closed the muscle and the fascia with 0 Vicryl, subcutaneous tissue with 2-0 Vicryl, and the subcuticular tissue with 3-0 Vicryl. The skin was closed with benzoin and Steri-Strips. A sterile dressing was applied, the patient was turned to the supine position and taken out of the headrest, awakened from general anesthesia and transferred to the recovery room in stable condition. At the end of the procedure all sponge, needle and instrument counts were correct.    PLAN OF CARE: Admit to inpatient   PATIENT DISPOSITION:  PACU - hemodynamically stable.   Delay start of Pharmacological VTE agent (>24hrs) due to surgical blood loss or risk of bleeding:  yes             -

## 2022-05-12 ENCOUNTER — Encounter (HOSPITAL_COMMUNITY): Payer: Self-pay | Admitting: Neurological Surgery

## 2022-05-12 MED ORDER — METHOCARBAMOL 750 MG PO TABS: 750.0000 mg | ORAL_TABLET | Freq: Four times a day (QID) | ORAL | 0 refills | Status: DC

## 2022-05-12 MED ORDER — OXYCODONE-ACETAMINOPHEN 5-325 MG PO TABS: 1.0000 | ORAL_TABLET | ORAL | 0 refills | Status: DC | PRN

## 2022-05-12 NOTE — Anesthesia Postprocedure Evaluation (Signed)
Anesthesia Post Note  Patient: Margaret Horn  Procedure(s) Performed: Posterior cervical fusion with lateral mass fixation - Cervical one- Cervical four     Patient location during evaluation: PACU Anesthesia Type: General Level of consciousness: awake and alert Pain management: pain level controlled Vital Signs Assessment: post-procedure vital signs reviewed and stable Respiratory status: spontaneous breathing, nonlabored ventilation, respiratory function stable and patient connected to nasal cannula oxygen Cardiovascular status: blood pressure returned to baseline and stable Postop Assessment: no apparent nausea or vomiting Anesthetic complications: no   No notable events documented.  Last Vitals:  Vitals:   05/12/22 0610 05/12/22 0721  BP: 136/69 129/69  Pulse: 69 68  Resp: 18 16  Temp: 36.6 C 36.9 C  SpO2: 97% 96%    Last Pain:  Vitals:   05/12/22 0926  TempSrc:   PainSc: [place]

## 2022-05-12 NOTE — Plan of Care (Signed)
  Problem: Education: Goal: Ability to verbalize activity precautions or restrictions will improve Outcome: Completed/Met Goal: Knowledge of the prescribed therapeutic regimen will improve Outcome: Completed/Met Goal: Understanding of discharge needs will improve Outcome: Completed/Met   Problem: Activity: Goal: Ability to avoid complications of mobility impairment will improve Outcome: Completed/Met Goal: Ability to tolerate increased activity will improve Outcome: Completed/Met Goal: Will remain free from falls Outcome: Completed/Met   Problem: Bowel/Gastric: Goal: Gastrointestinal status for postoperative course will improve Outcome: Completed/Met   Problem: Clinical Measurements: Goal: Ability to maintain clinical measurements within normal limits will improve Outcome: Completed/Met Goal: Postoperative complications will be avoided or minimized Outcome: Completed/Met Goal: Diagnostic test results will improve Outcome: Completed/Met   Problem: Pain Management: Goal: Pain level will decrease Outcome: Completed/Met   Problem: Skin Integrity: Goal: Will show signs of wound healing Outcome: Completed/Met   Problem: Health Behavior/Discharge Planning: Goal: Identification of resources available to assist in meeting health care needs will improve Outcome: Completed/Met   Problem: Bladder/Genitourinary: Goal: Urinary functional status for postoperative course will improve Outcome: Completed/Met  Patient alert and oriented, void, ambulate, VSS, surgical site clean and dry. D/c instructions explain and given to the patient. Pt. D/c home per order.

## 2022-05-12 NOTE — Evaluation (Signed)
Occupational Therapy Evaluation Patient Details Name: Margaret Horn MRN: [id number] DOB: April 06, 1950 Today's Date: 05/12/2022   History of Present Illness 72 yo F adm 2/7 for Cervical Fusion.  PMH includes:Anxiety       Arthritis       Depression       Hypertension       Pre-diabetes   Clinical Impression   Patient admitted for the procedure above.  PTA she lives with her spouse, who can provide any assist needed.  She is very close to her baseline, needing setup for ADL completion, and able to walk in the hall with generalized supervision.  Post op discomfort and unsteadiness are the deficits.  All precautions reviewed, ADL completed and mobility tested.  Patient verbalizes understanding, and no further OT needs exist in the acute setting.  Recommend follow up with MD as prescribed.        Recommendations for follow up therapy are one component of a multi-disciplinary discharge planning process, led by the attending physician.  Recommendations may be updated based on patient status, additional functional criteria and insurance authorization.   Follow Up Recommendations  No OT follow up     Assistance Recommended at Discharge Set up Supervision/Assistance  Patient can return home with the following Assist for transportation    Functional Status Assessment  Patient has had a recent decline in their functional status and demonstrates the ability to make significant improvements in function in a reasonable and predictable amount of time.  Equipment Recommendations  None recommended by OT    Recommendations for Other Services       Precautions / Restrictions Precautions Precautions: Cervical Precaution Booklet Issued: Yes (comment) Precaution Comments: verbalized understanidng Required Braces or Orthoses: Cervical Brace Cervical Brace: Soft collar;For comfort Restrictions Weight Bearing Restrictions: No      Mobility Bed Mobility Overal bed mobility: Modified Independent                   Transfers Overall transfer level: Modified independent                        Balance Overall balance assessment: Mild deficits observed, not formally tested                                         ADL either performed or assessed with clinical judgement   ADL Overall ADL's : At baseline                                             Vision Patient Visual Report: No change from baseline       Perception     Praxis      Pertinent Vitals/Pain Pain Assessment Pain Assessment: Faces Faces Pain Scale: Hurts little more Pain Location: Incisional Pain Descriptors / Indicators: Operative site guarding, Tender Pain Intervention(s): Monitored during session     Hand Dominance Right   Extremity/Trunk Assessment Upper Extremity Assessment Upper Extremity Assessment: Overall WFL for tasks assessed   Lower Extremity Assessment Lower Extremity Assessment: Overall WFL for tasks assessed   Cervical / Trunk Assessment Cervical / Trunk Assessment: Neck Surgery   Communication Communication Communication: No difficulties   Cognition Arousal/Alertness: Awake/alert Behavior During Therapy: WFL for tasks assessed/performed Overall  Cognitive Status: Within Functional Limits for tasks assessed                                       General Comments   VSS on RA    Exercises     Shoulder Instructions      Home Living Family/patient expects to be discharged to:: Private residence Living Arrangements: Spouse/significant other Available Help at Discharge: Family;Available 24 hours/day Type of Home: House Home Access: Stairs to enter [employer] of Steps: 1   Home Layout: One level     Bathroom Shower/Tub: Tub/shower unit;Walk-in shower   Bathroom Toilet: Standard Bathroom Accessibility: Yes   Home Equipment: Conservation officer, nature (2 wheels);Cane - single point;Hand held shower  head;Shower seat;Adaptive equipment Adaptive Equipment: Long-handled shoe horn        Prior Functioning/Environment Prior Level of Function : Independent/Modified Independent;Driving                        OT Problem List: Pain      OT Treatment/Interventions:      OT Goals(Current goals can be found in the care plan section) Acute Rehab OT Goals Patient Stated Goal: Return home OT Goal Formulation: With patient Time For Goal Achievement: 05/16/22 Potential to Achieve Goals: Good  OT Frequency:      Co-evaluation              AM-PAC OT "6 Clicks" Daily Activity     Outcome Measure Help from another person eating meals?: None Help from another person taking care of personal grooming?: None Help from another person toileting, which includes using toliet, bedpan, or urinal?: None Help from another person bathing (including washing, rinsing, drying)?: None Help from another person to put on and taking off regular upper body clothing?: None Help from another person to put on and taking off regular lower body clothing?: None 6 Click Score: 24   End of Session Equipment Utilized During Treatment: Cervical collar Nurse Communication: Mobility status  Activity Tolerance: Patient tolerated treatment well Patient left: in bed;with family/visitor present;with call bell/phone within reach  OT Visit Diagnosis: Unsteadiness on feet (R26.81)                Time: 9371-6967 OT Time Calculation (min): 20 min Charges:  OT General Charges $OT Visit: 1 Visit OT Evaluation $OT Eval Moderate Complexity: 1 Mod  05/12/2022  RP, OTR/L  Acute Rehabilitation Services  Office:  [phone]   Metta Clines 05/12/2022, 9:10 AM

## 2022-05-12 NOTE — Progress Notes (Signed)
Pt slept well overnight. No acute issues or distress. Pt voiding and tolerating PO intake well. Pain controlled with scheduled and PRN pain meds. Please see MAR for documentation.

## 2022-05-12 NOTE — Discharge Summary (Signed)
Physician Discharge Summary  Patient ID: Margaret Horn MRN: [id number] DOB/AGE: Aug 30, 1950 72 y.o.  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 The patient tolerated the procedure well and was taken to the recovery room and then to the floor in stable condition. The hospital course was routine. There were no complications. The wound remained clean dry and intact. Pt had appropriate neck soreness. No complaints of arm pain or new N/T/W. The patient remained afebrile with stable vital signs, and tolerated a regular diet. The patient continued to increase activities, and pain was well controlled with oral pain medications.   Consults: None  Significant Diagnostic Studies:  Results for orders placed or performed during the hospital encounter of 05/11/22  ABO/Rh  Result Value Ref Range   ABO/RH(D)      O POS Performed at [facility] [address], [place], [place] [address]     DG Cervical Spine 2 or 3 views  Result Date: 05/11/2022 CLINICAL DATA:  Surgical posterior fusion extending from C1-C4. EXAM: CERVICAL SPINE - 2-3 VIEW; DG C-ARM 1-60 MIN-NO REPORT Radiation exposure index: 7.11 mGy. COMPARISON:  February 18, 2022. FINDINGS: Two intraoperative fluoroscopic images were obtained of the cervical spine. These demonstrate the patient be status post surgical posterior fusion extending from C1-C4 with bilateral intrapedicular screw placement at all levels except C2. IMPRESSION: Fluoroscopic guidance provided during surgical posterior fusion extending from C1-C4. Electronically Signed   By: Marijo Conception M.D.   On: 05/11/2022 14:01   DG C-Arm 1-60 Min-No  Report  Result Date: 05/11/2022 CLINICAL DATA:  Surgical posterior fusion extending from C1-C4. EXAM: CERVICAL SPINE - 2-3 VIEW; DG C-ARM 1-60 MIN-NO REPORT Radiation exposure index: 7.11 mGy. COMPARISON:  February 18, 2022. FINDINGS: Two intraoperative fluoroscopic images were obtained of the cervical spine. These demonstrate the patient be status post surgical posterior fusion extending from C1-C4 with bilateral intrapedicular screw placement at all levels except C2. IMPRESSION: Fluoroscopic guidance provided during surgical posterior fusion extending from C1-C4. Electronically Signed   By: Marijo Conception M.D.   On: 05/11/2022 14:01   DG C-Arm 1-60 Min-No Report  Result Date: 05/11/2022 CLINICAL DATA:  Surgical posterior fusion extending from C1-C4. EXAM: CERVICAL SPINE - 2-3 VIEW; DG C-ARM 1-60 MIN-NO REPORT Radiation exposure index: 7.11 mGy. COMPARISON:  February 18, 2022. FINDINGS: Two intraoperative fluoroscopic images were obtained of the cervical spine. These demonstrate the patient be status post surgical posterior fusion extending from C1-C4 with bilateral intrapedicular screw placement at all levels except C2. IMPRESSION: Fluoroscopic guidance provided during surgical posterior fusion extending from C1-C4. Electronically Signed   By: Marijo Conception M.D.   On: 05/11/2022 14:01   DG C-Arm 1-60 Min-No Report  Result Date: 05/11/2022 CLINICAL DATA:  Surgical posterior fusion extending from C1-C4. EXAM: CERVICAL SPINE - 2-3 VIEW; DG C-ARM 1-60 MIN-NO REPORT Radiation exposure index: 7.11 mGy. COMPARISON:  February 18, 2022. FINDINGS: Two intraoperative fluoroscopic images were obtained of the cervical spine. These demonstrate the patient be status post surgical posterior fusion extending from C1-C4 with bilateral intrapedicular screw placement at all levels except C2. IMPRESSION: Fluoroscopic guidance provided during surgical posterior fusion extending from C1-C4. Electronically Signed   By:  Marijo Conception M.D.   On: 05/11/2022 14:01    Antibiotics:  Anti-infectives (From admission, onward)    Start     Dose/Rate Route Frequency Ordered Stop   05/11/22 1800  ceFAZolin (ANCEF) IVPB 2g/100 mL premix        2 g 200 mL/hr over 30 Minutes Intravenous Every 8 hours 05/11/22 1607 05/12/22 0148   05/11/22 0845  ceFAZolin (ANCEF) IVPB 2g/100 mL premix        2 g 200 mL/hr over 30 Minutes Intravenous On call to O.R. 05/11/22 0840 05/11/22 1140       Discharge Exam: Blood pressure 129/69, pulse 68, temperature 98.5 F (36.9 C), temperature source Oral, resp. rate 16, height '5\' 5"'$  (1.651 m), weight 76.2 kg, SpO2 96 %. Neurologic: Grossly normal Ambulating and voiding well incision cdi   Discharge Medications:   Allergies as of 05/12/2022       Reactions   Aspirin Other (See Comments)   GI Distress   Bisoprolol-hydrochlorothiazide Other (See Comments)   Joint pain   Bupropion Other (See Comments)   Ineffective   Codeine Other (See Comments)   Hallucinations   Ezetimibe Diarrhea   Paxil [paroxetine Hcl]    Weight gain   Sertraline Other (See Comments)   numbness   Zoster Vac Recomb Adjuvanted Other (See Comments)   Chest pain after 1st dose   Ciprofloxacin Rash   Latex Rash        Medication List     TAKE these medications    ascorbic acid 500 MG tablet Commonly known as: VITAMIN C Take 500 mg by mouth daily.   atenolol 50 MG tablet Commonly known as: TENORMIN Take 50 mg by mouth daily.   atorvastatin 20 MG tablet Commonly known as: LIPITOR Take 20 mg by mouth daily.   Co Q10 60 MG Caps Take 60 mg by mouth daily.   COLLAGEN PO Take 2,000 mg by mouth daily.   methocarbamol 750 MG tablet Commonly known as: Robaxin-750 Take 1 tablet (750 mg total) by mouth 4 (four) times daily.   multivitamin with minerals Tabs tablet Take 1 tablet by mouth daily.   NAC 600 MG Caps Generic drug: Acetylcysteine Take 600 mg by mouth daily.   naproxen sodium 220 MG  tablet Commonly known as: ALEVE Take 220 mg by mouth in the morning.   Olmesartan-amLODIPine-HCTZ 40-5-25 MG Tabs Take 1 tablet by mouth daily.   oxyCODONE-acetaminophen 5-325 MG tablet Commonly known as: Percocet Take 1 tablet by mouth every 4 (four) hours as needed for severe pain.   Potassium 99 MG Tabs Take 99 mg by mouth daily.   pyridOXINE 100 MG tablet Commonly known as: VITAMIN B6 Take 100 mg by mouth daily.   QUERCETIN PO Take 1 capsule by mouth daily.   SUPER B COMPLEX PO Take 1 tablet by mouth daily.   Vitamin D-3 125 MCG (5000 UT) Tabs Take 5,000 Units by mouth daily.   zinc gluconate 50 MG tablet Take 50 mg by mouth daily.        Disposition: home   Final Dx: posterior lateral fusion C1-C4  Discharge Instructions      Remove dressing in 72 hours   Complete by: As directed    Call MD for:  difficulty breathing, headache or visual disturbances   Complete by: As directed    Call MD for:  hives   Complete by: As directed    Call MD for:  persistant dizziness or  light-headedness   Complete by: As directed    Call MD for:  persistant nausea and vomiting   Complete by: As directed    Call MD for:  redness, tenderness, or signs of infection (pain, swelling, redness, odor or green/yellow discharge around incision site)   Complete by: As directed    Call MD for:  severe uncontrolled pain   Complete by: As directed    Call MD for:  temperature >100.4   Complete by: As directed    Diet - low sodium heart healthy   Complete by: As directed    Driving Restrictions   Complete by: As directed    No driving for 2 weeks, no riding in the car for 1 week   Increase activity slowly   Complete by: As directed    Lifting restrictions   Complete by: As directed    No lifting more than 8 lbs          Signed: Ocie Cornfield  05/12/2022, 7:46 AM

## 2022-08-24 IMAGING — CR DG CERVICAL SPINE 2 OR 3 VIEWS
3 series · 3 of 3 positions shown · non-contrast
Comparison: None.

CLINICAL DATA: Intermittent left-sided neck pain radiating to
shoulder

EXAM:
CERVICAL SPINE - 2-3 VIEW

[w cervical spine lat]
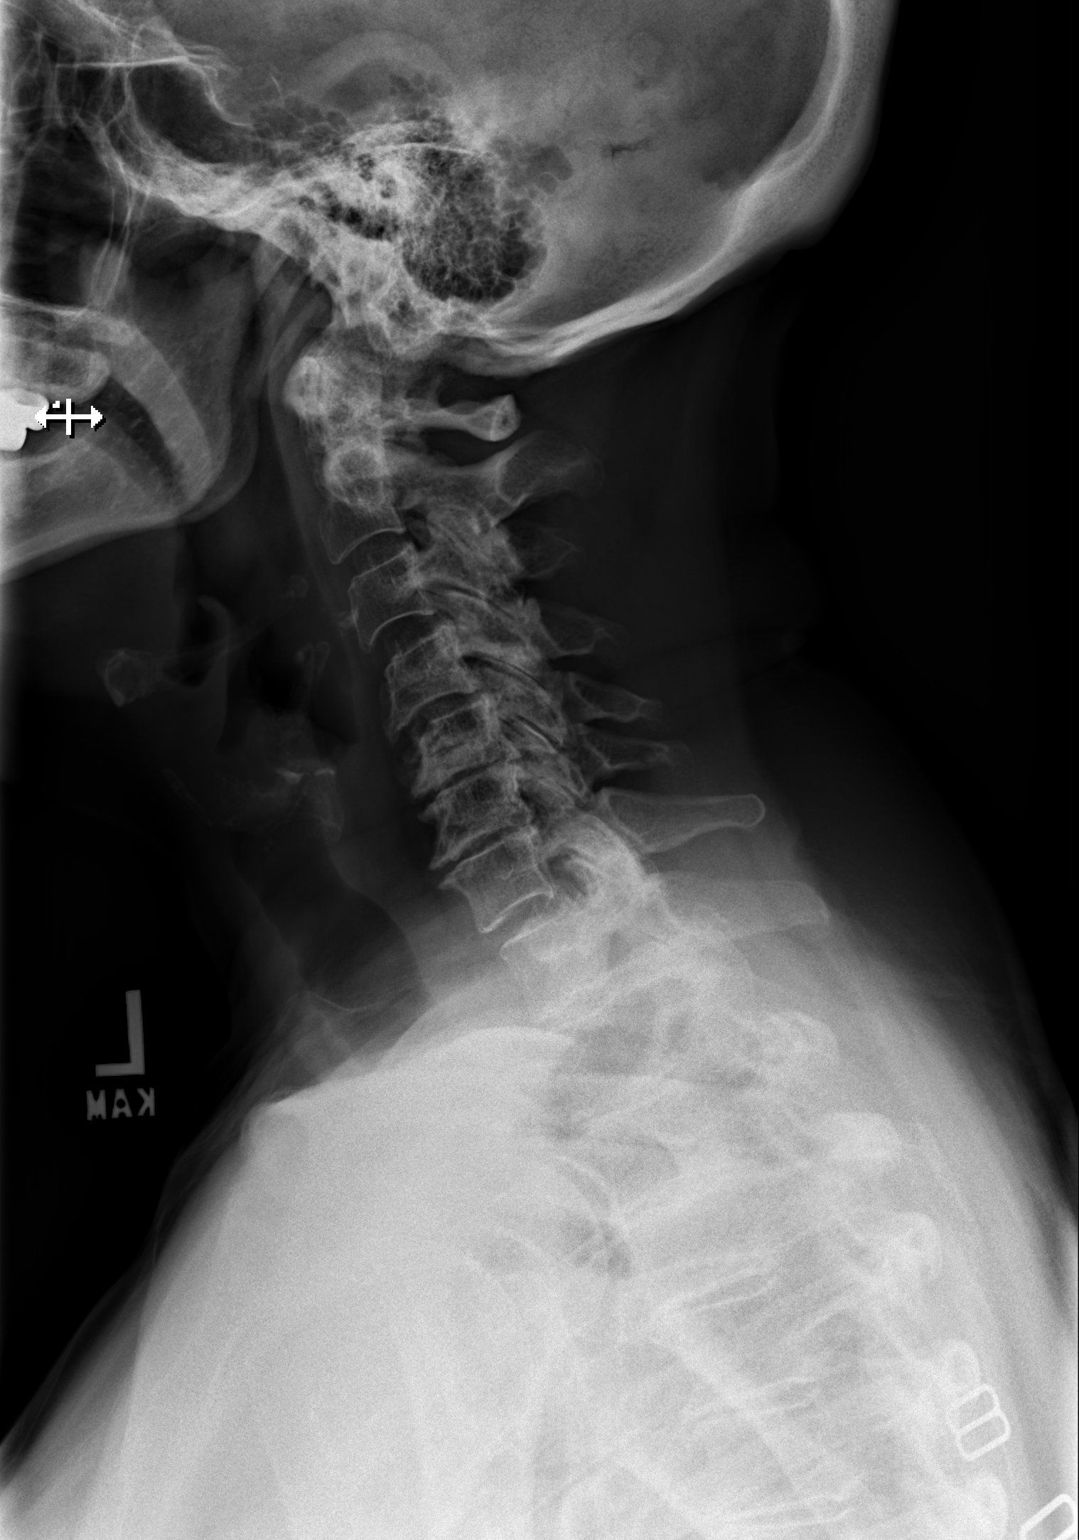

[w cervical spine ap]
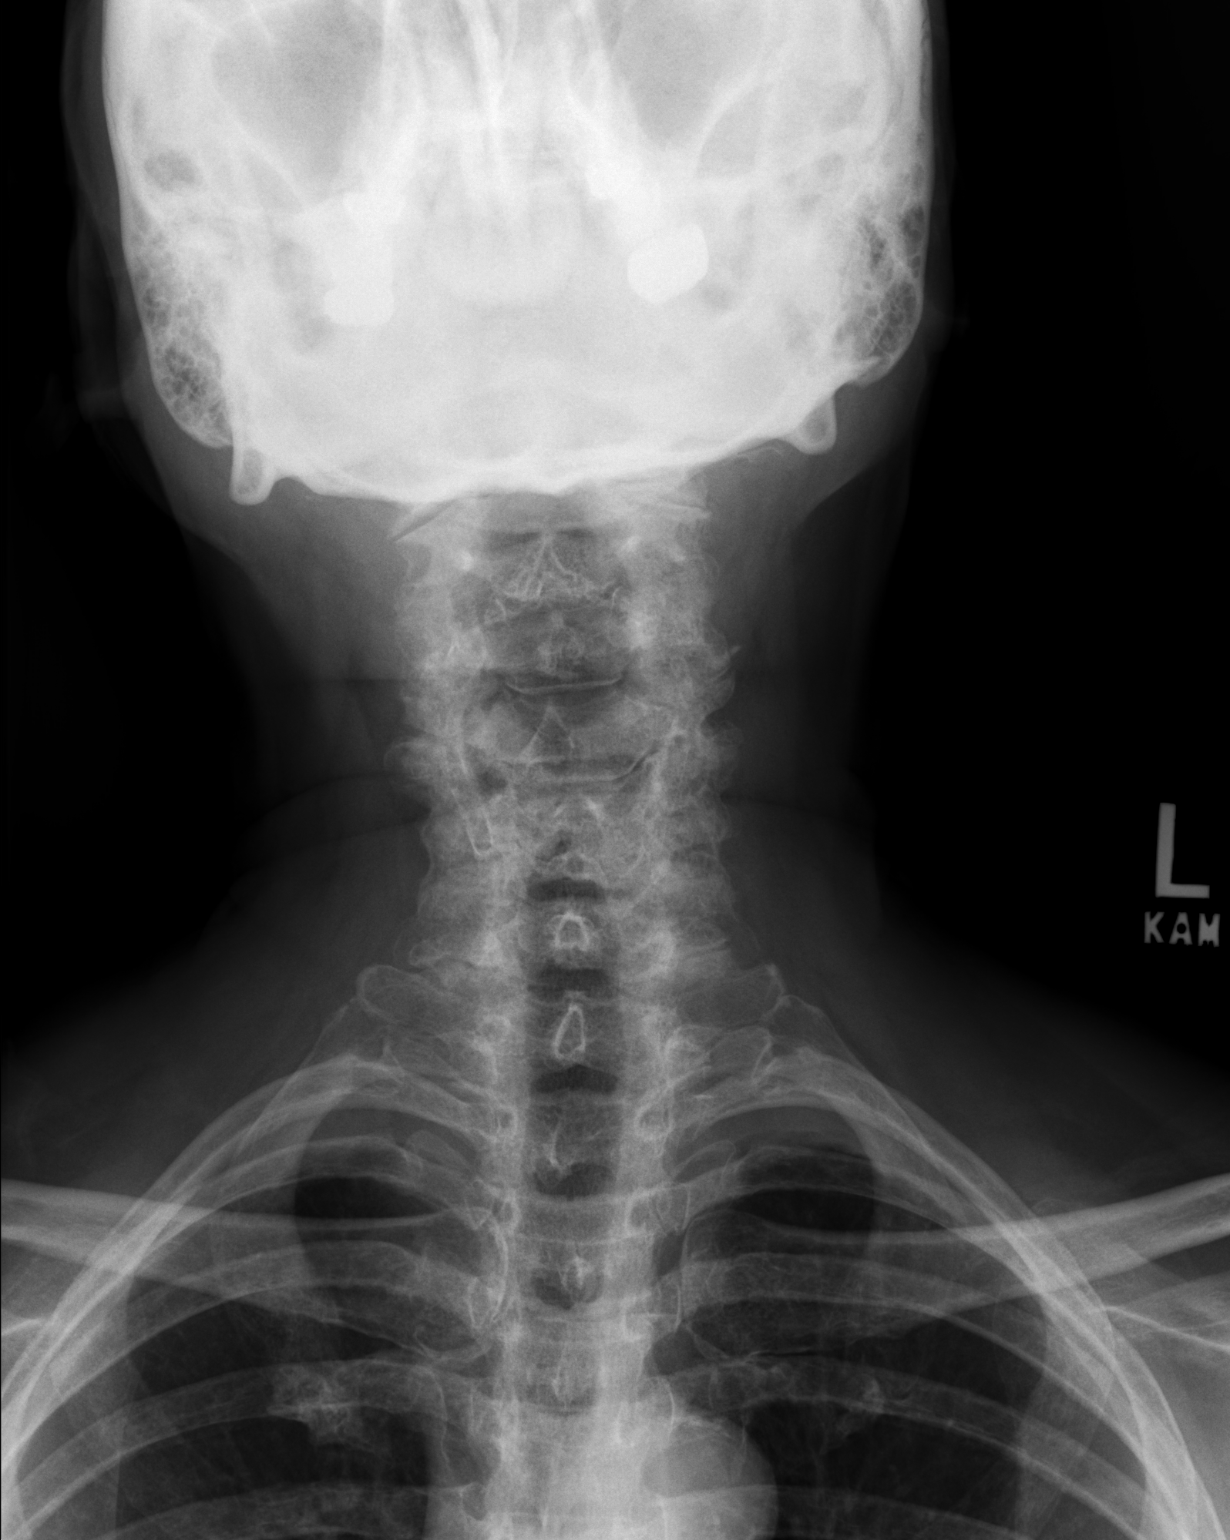

[w cervical spine odontoid]
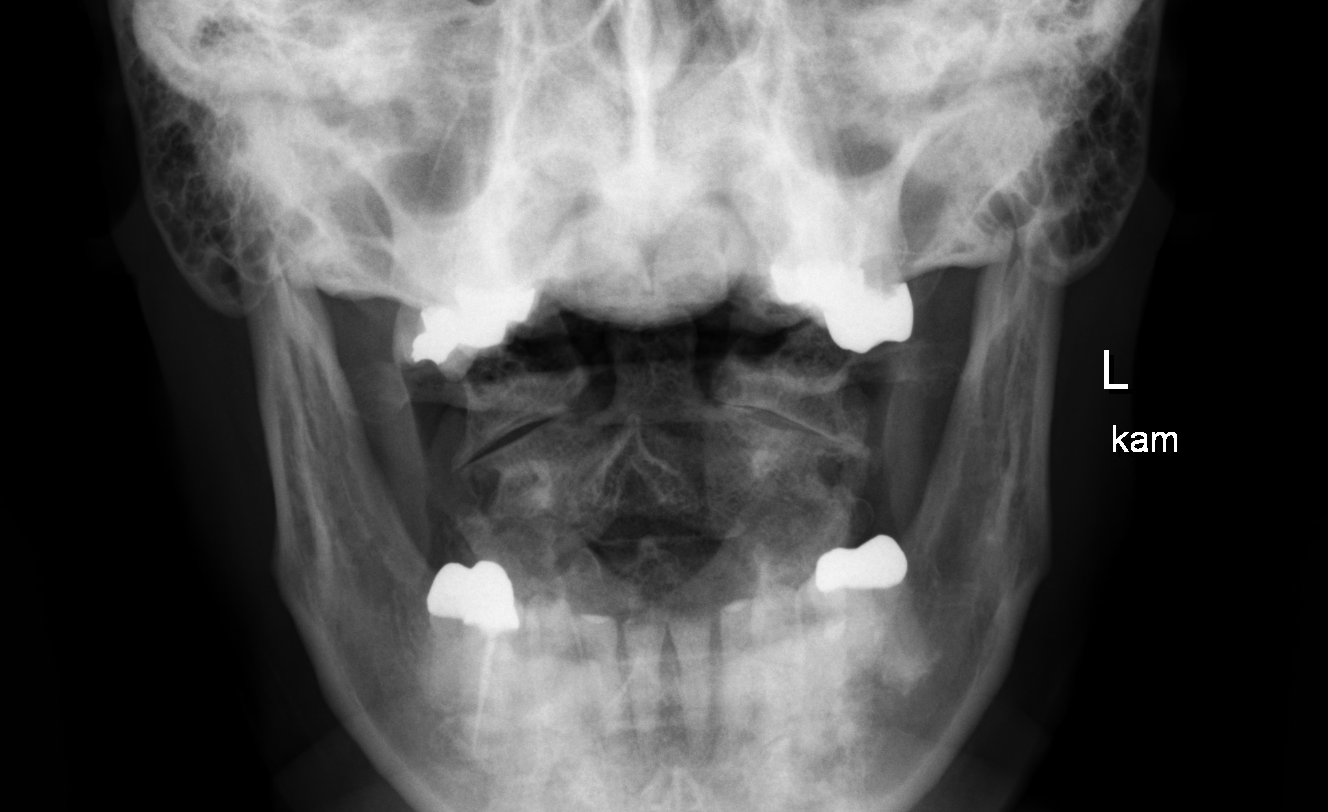

[3 of 3 positions shown; findings below may reference images not displayed]

FINDINGS: Frontal and lateral views of the cervical spine are obtained. There
is loss of normal cervical lordosis with straightening of cervical
spine from C4 through C7 due to multilevel spondylosis and facet
hypertrophy. These changes are most pronounced at the C5-6 level.
Significant facet hypertrophy also seen at C2-3 and C3-4. No acute
fractures. Prevertebral soft tissues are unremarkable. Lung apices
are clear.
IMPRESSION: 1. Extensive multilevel cervical spondylosis and facet hypertrophy,
greatest at C5-6.

## 2022-10-26 ENCOUNTER — Other Ambulatory Visit: Payer: Self-pay | Admitting: Neurological Surgery

## 2022-11-04 NOTE — Pre-Procedure Instructions (Signed)
Surgical Instructions   Your procedure is scheduled on November 16, 2022. Report to [facility] Main Entrance "A" at 7:45 A.M., then check in with the Admitting office. Any questions or running late day of surgery: call [phone]  Questions prior to your surgery date: call [phone], Monday-Friday, 8am-4pm. If you experience any cold or flu symptoms such as cough, fever, chills, shortness of breath, etc. between now and your scheduled surgery, please notify us at the above number.     Remember:  Do not eat or drink after midnight the night before your surgery    Take these medicines the morning of surgery with A SIP OF WATER: atenolol (TENORMIN)  atorvastatin (LIPITOR)  methocarbamol (ROBAXIN-750)    May take these medicines IF NEEDED: oxyCODONE-acetaminophen (PERCOCET)    One week prior to surgery, STOP taking any Aspirin (unless otherwise instructed by your surgeon) Aleve, Naproxen, Ibuprofen, Motrin, Advil, Goody's, BC's, all herbal medications, fish oil, and non-prescription vitamins.                     Do NOT Smoke (Tobacco/Vaping) for 24 hours prior to your procedure.  If you use a CPAP at night, you may bring your mask/headgear for your overnight stay.   You will be asked to remove any contacts, glasses, piercing's, hearing aid's, dentures/partials prior to surgery. Please bring cases for these items if needed.    Patients discharged the day of surgery will not be allowed to drive home, and someone needs to stay with them for 24 hours.  SURGICAL WAITING ROOM VISITATION Patients may have no more than 2 support people in the waiting area - these visitors may rotate.   Pre-op nurse will coordinate an appropriate time for 1 ADULT support person, who may not rotate, to accompany patient in pre-op.  Children under the age of 26 must have an adult with them who is not the patient and must remain in the main waiting area with an adult.  If the patient needs to stay at the  hospital during part of their recovery, the visitor guidelines for inpatient rooms apply.  Please refer to the [facility] website for the visitor guidelines for any additional information.   If you received a COVID test during your pre-op visit  it is requested that you wear a mask when out in public, stay away from anyone that may not be feeling well and notify your surgeon if you develop symptoms. If you have been in contact with anyone that has tested positive in the last 10 days please notify you surgeon.      Pre-operative 5 CHG Bathing Instructions   You can play a key role in reducing the risk of infection after surgery. Your skin needs to be as free of germs as possible. You can reduce the number of germs on your skin by washing with CHG (chlorhexidine gluconate) soap before surgery. CHG is an antiseptic soap that kills germs and continues to kill germs even after washing.   DO NOT use if you have an allergy to chlorhexidine/CHG or antibacterial soaps. If your skin becomes reddened or irritated, stop using the CHG and notify one of our RNs at [phone].   Please shower with the CHG soap starting 4 days before surgery using the following schedule:     Please keep in mind the following:  DO NOT shave, including legs and underarms, starting the day of your first shower.   You may shave your face at any  point before/day of surgery.  Place clean sheets on your bed the day you start using CHG soap. Use a clean washcloth (not used since being washed) for each shower. DO NOT sleep with pets once you start using the CHG.   CHG Shower Instructions:  If you choose to wash your hair and private area, wash first with your normal shampoo/soap.  After you use shampoo/soap, rinse your hair and body thoroughly to remove shampoo/soap residue.  Turn the water OFF and apply about 3 tablespoons (45 ml) of CHG soap to a CLEAN washcloth.  Apply CHG soap ONLY FROM YOUR NECK DOWN TO YOUR TOES  (washing for 3-5 minutes)  DO NOT use CHG soap on face, private areas, open wounds, or sores.  Pay special attention to the area where your surgery is being performed.  If you are having back surgery, having someone wash your back for you may be helpful. Wait 2 minutes after CHG soap is applied, then you may rinse off the CHG soap.  Pat dry with a clean towel  Put on clean clothes/pajamas   If you choose to wear lotion, please use ONLY the CHG-compatible lotions on the back of this paper.   Additional instructions for the day of surgery: DO NOT APPLY any lotions, deodorants, cologne, or perfumes.   Do not bring valuables to the hospital. [facility] is not responsible for any belongings/valuables. Do not wear nail polish, gel polish, artificial nails, or any other type of covering on natural nails (fingers and toes) Do not wear jewelry or makeup Put on clean/comfortable clothes.  Please brush your teeth.  Ask your nurse before applying any prescription medications to the skin.     CHG Compatible Lotions   Aveeno Moisturizing lotion  Cetaphil Moisturizing Cream  Cetaphil Moisturizing Lotion  Clairol Herbal Essence Moisturizing Lotion, Dry Skin  Clairol Herbal Essence Moisturizing Lotion, Extra Dry Skin  Clairol Herbal Essence Moisturizing Lotion, Normal Skin  Curel Age Defying Therapeutic Moisturizing Lotion with Alpha Hydroxy  Curel Extreme Care Body Lotion  Curel Soothing Hands Moisturizing Hand Lotion  Curel Therapeutic Moisturizing Cream, Fragrance-Free  Curel Therapeutic Moisturizing Lotion, Fragrance-Free  Curel Therapeutic Moisturizing Lotion, Original Formula  Eucerin Daily Replenishing Lotion  Eucerin Dry Skin Therapy Plus Alpha Hydroxy Crme  Eucerin Dry Skin Therapy Plus Alpha Hydroxy Lotion  Eucerin Original Crme  Eucerin Original Lotion  Eucerin Plus Crme Eucerin Plus Lotion  Eucerin TriLipid Replenishing Lotion  Keri Anti-Bacterial Hand Lotion  Keri Deep  Conditioning Original Lotion Dry Skin Formula Softly Scented  Keri Deep Conditioning Original Lotion, Fragrance Free Sensitive Skin Formula  Keri Lotion Fast Absorbing Fragrance Free Sensitive Skin Formula  Keri Lotion Fast Absorbing Softly Scented Dry Skin Formula  Keri Original Lotion  Keri Skin Renewal Lotion Keri Silky Smooth Lotion  Keri Silky Smooth Sensitive Skin Lotion  Nivea Body Creamy Conditioning Oil  Nivea Body Extra Enriched Lotion  Nivea Body Original Lotion  Nivea Body Sheer Moisturizing Lotion Nivea Crme  Nivea Skin Firming Lotion  NutraDerm 30 Skin Lotion  NutraDerm Skin Lotion  NutraDerm Therapeutic Skin Cream  NutraDerm Therapeutic Skin Lotion  ProShield Protective Hand Cream  Provon moisturizing lotion  Please read over the following fact sheets that you were given.

## 2022-11-07 ENCOUNTER — Encounter (HOSPITAL_COMMUNITY)
Admission: RE | Admit: 2022-11-07 | Discharge: 2022-11-07 | Disposition: A | Discharge status: Home / Self Care | Payer: Medicare Other | Source: Ambulatory Visit | Attending: Neurological Surgery | Admitting: Neurological Surgery

## 2022-11-07 ENCOUNTER — Other Ambulatory Visit: Payer: Self-pay

## 2022-11-07 ENCOUNTER — Encounter (HOSPITAL_COMMUNITY): Payer: Self-pay

## 2022-11-07 VITALS — BP 133/82 | HR 63 | Temp 98.5°F | Resp 18 | Ht 66.0 in | Wt 166.2 lb

## 2022-11-07 DIAGNOSIS — I2510 Atherosclerotic heart disease of native coronary artery without angina pectoris: Secondary | ICD-10-CM

## 2022-11-07 DIAGNOSIS — Z01812 Encounter for preprocedural laboratory examination: Secondary | ICD-10-CM | POA: Diagnosis present

## 2022-11-07 DIAGNOSIS — Z01818 Encounter for other preprocedural examination: Secondary | ICD-10-CM

## 2022-11-07 LAB — CBC
HCT: 43 % (ref 36.0–46.0)
Hemoglobin: 14.7 g/dL (ref 12.0–15.0)
MCH: 32 pg (ref 26.0–34.0)
MCHC: 34.2 g/dL (ref 30.0–36.0)
MCV: 93.7 fL (ref 80.0–100.0)
Platelets: 180 10*3/uL (ref 150–400)
RBC: 4.59 MIL/uL (ref 3.87–5.11)
RDW: 13.3 % (ref 11.5–15.5)
WBC: 5.6 10*3/uL (ref 4.0–10.5)
nRBC: 0 % (ref 0.0–0.2)

## 2022-11-07 LAB — BASIC METABOLIC PANEL
Anion gap: 12 (ref 5–15)
BUN: 18 mg/dL (ref 8–23)
CO2: 27 mmol/L (ref 22–32)
Calcium: 9.7 mg/dL (ref 8.9–10.3)
Chloride: 101 mmol/L (ref 98–111)
Creatinine, Ser: 0.91 mg/dL (ref 0.44–1.00)
GFR, Estimated: >60 mL/min (ref >60)
Glucose, Bld: 119 mg/dL — ABNORMAL HIGH (ref 70–99)
Potassium: 3.7 mmol/L (ref 3.5–5.1)
Sodium: 140 mmol/L (ref 135–145)

## 2022-11-07 LAB — PROTIME-INR
INR: 1 (ref 0.8–1.2)
Prothrombin Time: 13 seconds (ref 11.4–15.2)

## 2022-11-07 LAB — SURGICAL PCR SCREEN
MRSA, PCR: NEGATIVE
Staphylococcus aureus: POSITIVE — AB

## 2022-11-07 NOTE — Progress Notes (Signed)
PCP - Dr. Laurann Montana Cardiologist - Denies  PPM/ICD - Denies Device Orders - n/a Rep Notified - n/a  Chest x-ray - Denies EKG - 05/04/2022 Stress Test - Denies ECHO - Denies Cardiac Cath - Denies  Sleep Study - Denies CPAP - n/a  Pt is Pre-DM  Last dose of GLP1 agonist- n/a GLP1 instructions: n/a  Blood Thinner Instructions: n/a Aspirin Instructions: n/a  NPO after midnight  COVID TEST- n/a   Anesthesia review: No.   Patient denies shortness of breath, fever, cough and chest pain at PAT appointment. Pt denies any respiratory illness/infection in the last two months.   All instructions explained to the patient, with a verbal understanding of the material. Patient agrees to go over the instructions while at home for a better understanding. Patient also instructed to self quarantine after being tested for COVID-19. The opportunity to ask questions was provided.

## 2022-11-15 NOTE — Progress Notes (Signed)
Pt understands new time change and agrees to be here at 0815.

## 2022-11-16 ENCOUNTER — Ambulatory Visit (HOSPITAL_COMMUNITY): Payer: Medicare Other

## 2022-11-16 ENCOUNTER — Ambulatory Visit (HOSPITAL_COMMUNITY)
Admission: RE | Disposition: A | Discharge status: Home / Self Care | Payer: Self-pay | Source: Home / Self Care | Attending: Neurological Surgery

## 2022-11-16 ENCOUNTER — Ambulatory Visit (HOSPITAL_BASED_OUTPATIENT_CLINIC_OR_DEPARTMENT_OTHER): Payer: Medicare Other | Admitting: Anesthesiology

## 2022-11-16 ENCOUNTER — Encounter (HOSPITAL_COMMUNITY): Payer: Self-pay | Admitting: Neurological Surgery

## 2022-11-16 ENCOUNTER — Observation Stay (HOSPITAL_COMMUNITY)
Admission: RE | Admit: 2022-11-16 | Discharge: 2022-11-17 | Disposition: A | Discharge status: Home / Self Care | Payer: Medicare Other | Attending: Neurological Surgery | Admitting: Neurological Surgery

## 2022-11-16 ENCOUNTER — Other Ambulatory Visit: Payer: Self-pay

## 2022-11-16 ENCOUNTER — Ambulatory Visit (HOSPITAL_COMMUNITY): Payer: Medicare Other | Admitting: Anesthesiology

## 2022-11-16 DIAGNOSIS — Z79899 Other long term (current) drug therapy: Secondary | ICD-10-CM | POA: Insufficient documentation

## 2022-11-16 DIAGNOSIS — M47812 Spondylosis without myelopathy or radiculopathy, cervical region: Secondary | ICD-10-CM | POA: Diagnosis not present

## 2022-11-16 DIAGNOSIS — I10 Essential (primary) hypertension: Secondary | ICD-10-CM | POA: Insufficient documentation

## 2022-11-16 DIAGNOSIS — M4682 Other specified inflammatory spondylopathies, cervical region: Secondary | ICD-10-CM | POA: Diagnosis not present

## 2022-11-16 DIAGNOSIS — M47892 Other spondylosis, cervical region: Secondary | ICD-10-CM | POA: Diagnosis present

## 2022-11-16 DIAGNOSIS — X58XXXA Exposure to other specified factors, initial encounter: Secondary | ICD-10-CM | POA: Insufficient documentation

## 2022-11-16 DIAGNOSIS — M4802 Spinal stenosis, cervical region: Secondary | ICD-10-CM

## 2022-11-16 DIAGNOSIS — S13160A Subluxation of C5/C6 cervical vertebrae, initial encounter: Secondary | ICD-10-CM | POA: Insufficient documentation

## 2022-11-16 DIAGNOSIS — S13150A Subluxation of C4/C5 cervical vertebrae, initial encounter: Secondary | ICD-10-CM | POA: Diagnosis not present

## 2022-11-16 DIAGNOSIS — Z91040 Latex allergy status: Secondary | ICD-10-CM | POA: Insufficient documentation

## 2022-11-16 DIAGNOSIS — Z981 Arthrodesis status: Principal | ICD-10-CM

## 2022-11-16 HISTORY — PX: ANTERIOR CERVICAL DECOMP/DISCECTOMY FUSION: SHX1161

## 2022-11-16 SURGERY — ANTERIOR CERVICAL DECOMPRESSION/DISCECTOMY FUSION 2 LEVELS
Anesthesia: General

## 2022-11-16 MED ORDER — OXYCODONE HCL 5 MG PO TABS: 5.0000 mg | ORAL_TABLET | Freq: Once | ORAL | Status: DC | PRN

## 2022-11-16 MED ORDER — ONDANSETRON HCL 4 MG/2ML IJ SOLN
INTRAMUSCULAR | Status: AC
  Filled 2022-11-16: qty 2

## 2022-11-16 MED ORDER — ONDANSETRON HCL 4 MG/2ML IJ SOLN: 4.0000 mg | Freq: Once | INTRAMUSCULAR | Status: DC | PRN

## 2022-11-16 MED ORDER — PHENOL 1.4 % MT LIQD: 1.0000 | OROMUCOSAL | Status: DC | PRN

## 2022-11-16 MED ORDER — 0.9 % SODIUM CHLORIDE (POUR BTL) OPTIME
TOPICAL | Status: DC | PRN
  Administered 2022-11-16: 1000 mL

## 2022-11-16 MED ORDER — FENTANYL CITRATE (PF) 250 MCG/5ML IJ SOLN
INTRAMUSCULAR | Status: DC | PRN
  Administered 2022-11-16 (×3): 50 ug via INTRAVENOUS
  Administered 2022-11-16: 100 ug via INTRAVENOUS

## 2022-11-16 MED ORDER — SENNA 8.6 MG PO TABS
1.0000 | ORAL_TABLET | Freq: Two times a day (BID) | ORAL | Status: DC
  Administered 2022-11-16 – 2022-11-17 (×2): 8.6 mg via ORAL
  Filled 2022-11-16 (×2): qty 1

## 2022-11-16 MED ORDER — MORPHINE SULFATE (PF) 2 MG/ML IV SOLN: 2.0000 mg | INTRAVENOUS | Status: DC | PRN

## 2022-11-16 MED ORDER — ONDANSETRON HCL 4 MG/2ML IJ SOLN: 4.0000 mg | Freq: Four times a day (QID) | INTRAMUSCULAR | Status: DC | PRN

## 2022-11-16 MED ORDER — BUPIVACAINE HCL (PF) 0.25 % IJ SOLN
INTRAMUSCULAR | Status: DC | PRN
  Administered 2022-11-16: 7 mL

## 2022-11-16 MED ORDER — THROMBIN 5000 UNITS EX SOLR: CUTANEOUS | Status: DC | PRN

## 2022-11-16 MED ORDER — ROCURONIUM BROMIDE 10 MG/ML (PF) SYRINGE
PREFILLED_SYRINGE | INTRAVENOUS | Status: AC
  Filled 2022-11-16: qty 10

## 2022-11-16 MED ORDER — MENTHOL 3 MG MT LOZG: 1.0000 | LOZENGE | OROMUCOSAL | Status: DC | PRN

## 2022-11-16 MED ORDER — ACETAMINOPHEN 325 MG PO TABS: 650.0000 mg | ORAL_TABLET | ORAL | Status: DC | PRN

## 2022-11-16 MED ORDER — ACETAMINOPHEN 500 MG PO TABS
ORAL_TABLET | ORAL | Status: AC
  Administered 2022-11-16: 1000 mg via ORAL
  Filled 2022-11-16: qty 2

## 2022-11-16 MED ORDER — POTASSIUM CHLORIDE IN NACL 20-0.9 MEQ/L-% IV SOLN: INTRAVENOUS | Status: DC

## 2022-11-16 MED ORDER — OXYCODONE-ACETAMINOPHEN 5-325 MG PO TABS
1.0000 | ORAL_TABLET | ORAL | Status: DC | PRN
  Administered 2022-11-16 – 2022-11-17 (×5): 1 via ORAL
  Filled 2022-11-16 (×5): qty 1

## 2022-11-16 MED ORDER — ORAL CARE MOUTH RINSE: 15.0000 mL | Freq: Once | OROMUCOSAL | Status: AC

## 2022-11-16 MED ORDER — CEFAZOLIN SODIUM-DEXTROSE 2-4 GM/100ML-% IV SOLN
INTRAVENOUS | Status: AC
  Filled 2022-11-16: qty 100

## 2022-11-16 MED ORDER — THROMBIN 5000 UNITS EX SOLR
CUTANEOUS | Status: AC
  Filled 2022-11-16: qty 5000

## 2022-11-16 MED ORDER — DEXAMETHASONE SODIUM PHOSPHATE 10 MG/ML IJ SOLN
INTRAMUSCULAR | Status: DC | PRN
  Administered 2022-11-16: 10 mg via INTRAVENOUS

## 2022-11-16 MED ORDER — DEXAMETHASONE SODIUM PHOSPHATE 4 MG/ML IJ SOLN: 4.0000 mg | Freq: Four times a day (QID) | INTRAMUSCULAR | Status: DC

## 2022-11-16 MED ORDER — METHOCARBAMOL 1000 MG/10ML IJ SOLN: 500.0000 mg | Freq: Four times a day (QID) | INTRAVENOUS | Status: DC | PRN

## 2022-11-16 MED ORDER — ADULT MULTIVITAMIN W/MINERALS CH
1.0000 | ORAL_TABLET | Freq: Every day | ORAL | Status: DC
  Administered 2022-11-17: 1 via ORAL
  Filled 2022-11-16: qty 1

## 2022-11-16 MED ORDER — HYDROCHLOROTHIAZIDE 25 MG PO TABS
25.0000 mg | ORAL_TABLET | Freq: Every day | ORAL | Status: DC
  Administered 2022-11-16 – 2022-11-17 (×2): 25 mg via ORAL
  Filled 2022-11-16 (×2): qty 1

## 2022-11-16 MED ORDER — ROCURONIUM BROMIDE 10 MG/ML (PF) SYRINGE
PREFILLED_SYRINGE | INTRAVENOUS | Status: DC | PRN
  Administered 2022-11-16: 15 mg via INTRAVENOUS
  Administered 2022-11-16: 10 mg via INTRAVENOUS
  Administered 2022-11-16: 60 mg via INTRAVENOUS

## 2022-11-16 MED ORDER — ONDANSETRON HCL 4 MG/2ML IJ SOLN
INTRAMUSCULAR | Status: DC | PRN
  Administered 2022-11-16: 4 mg via INTRAVENOUS

## 2022-11-16 MED ORDER — STERILE WATER FOR IRRIGATION IR SOLN
Status: DC | PRN
Start: 2022-11-16 — End: 2022-11-16
  Administered 2022-11-16: 1000 mL

## 2022-11-16 MED ORDER — THROMBIN 5000 UNITS EX SOLR: OROMUCOSAL | Status: DC | PRN

## 2022-11-16 MED ORDER — LIDOCAINE 2% (20 MG/ML) 5 ML SYRINGE
INTRAMUSCULAR | Status: AC
  Filled 2022-11-16: qty 5

## 2022-11-16 MED ORDER — ONDANSETRON HCL 4 MG PO TABS: 4.0000 mg | ORAL_TABLET | Freq: Four times a day (QID) | ORAL | Status: DC | PRN

## 2022-11-16 MED ORDER — BUPIVACAINE HCL (PF) 0.25 % IJ SOLN
INTRAMUSCULAR | Status: AC
  Filled 2022-11-16: qty 30

## 2022-11-16 MED ORDER — CHLORHEXIDINE GLUCONATE CLOTH 2 % EX PADS: 6.0000 | MEDICATED_PAD | Freq: Once | CUTANEOUS | Status: DC

## 2022-11-16 MED ORDER — SODIUM CHLORIDE 0.9 % IV SOLN: 250.0000 mL | INTRAVENOUS | Status: DC

## 2022-11-16 MED ORDER — FENTANYL CITRATE (PF) 250 MCG/5ML IJ SOLN
INTRAMUSCULAR | Status: AC
  Filled 2022-11-16: qty 5

## 2022-11-16 MED ORDER — VITAMIN B-6 100 MG PO TABS: 200.0000 mg | ORAL_TABLET | Freq: Every day | ORAL | Status: DC

## 2022-11-16 MED ORDER — POTASSIUM 99 MG PO TABS: 99.0000 mg | ORAL_TABLET | Freq: Every day | ORAL | Status: DC

## 2022-11-16 MED ORDER — ACETYLCYSTEINE 600 MG PO CAPS: 600.0000 mg | ORAL_CAPSULE | Freq: Every day | ORAL | Status: DC

## 2022-11-16 MED ORDER — SODIUM CHLORIDE 0.9% FLUSH: 3.0000 mL | INTRAVENOUS | Status: DC | PRN

## 2022-11-16 MED ORDER — FENTANYL CITRATE (PF) 100 MCG/2ML IJ SOLN
INTRAMUSCULAR | Status: AC
  Filled 2022-11-16: qty 2

## 2022-11-16 MED ORDER — LIDOCAINE 2% (20 MG/ML) 5 ML SYRINGE
INTRAMUSCULAR | Status: DC | PRN
  Administered 2022-11-16: 60 mg via INTRAVENOUS

## 2022-11-16 MED ORDER — METHOCARBAMOL 500 MG PO TABS
500.0000 mg | ORAL_TABLET | Freq: Four times a day (QID) | ORAL | Status: DC | PRN
  Administered 2022-11-16 – 2022-11-17 (×3): 500 mg via ORAL
  Filled 2022-11-16 (×3): qty 1

## 2022-11-16 MED ORDER — SODIUM CHLORIDE 0.9% FLUSH: 3.0000 mL | Freq: Two times a day (BID) | INTRAVENOUS | Status: DC

## 2022-11-16 MED ORDER — ACETAMINOPHEN 500 MG PO TABS: 1000.0000 mg | ORAL_TABLET | ORAL | Status: AC

## 2022-11-16 MED ORDER — ZINC GLUCONATE 50 MG PO TABS: 50.0000 mg | ORAL_TABLET | Freq: Every day | ORAL | Status: DC

## 2022-11-16 MED ORDER — DEXAMETHASONE 4 MG PO TABS
4.0000 mg | ORAL_TABLET | Freq: Four times a day (QID) | ORAL | Status: DC
  Administered 2022-11-16 – 2022-11-17 (×3): 4 mg via ORAL
  Filled 2022-11-16 (×3): qty 1

## 2022-11-16 MED ORDER — DEXMEDETOMIDINE HCL IN NACL 80 MCG/20ML IV SOLN
INTRAVENOUS | Status: DC | PRN
Start: 2022-11-16 — End: 2022-11-16
  Administered 2022-11-16: 8 ug via INTRAVENOUS
  Administered 2022-11-16: 4 ug via INTRAVENOUS
  Administered 2022-11-16: 8 ug via INTRAVENOUS

## 2022-11-16 MED ORDER — PHENYLEPHRINE HCL-NACL 20-0.9 MG/250ML-% IV SOLN
INTRAVENOUS | Status: DC | PRN
  Administered 2022-11-16: 30 ug/min via INTRAVENOUS

## 2022-11-16 MED ORDER — OXYCODONE HCL 5 MG/5ML PO SOLN: 5.0000 mg | Freq: Once | ORAL | Status: DC | PRN

## 2022-11-16 MED ORDER — THROMBIN (RECOMBINANT) 5000 UNITS EX SOLR
CUTANEOUS | Status: DC | PRN
  Administered 2022-11-16: 10 mL via TOPICAL

## 2022-11-16 MED ORDER — GABAPENTIN 300 MG PO CAPS
ORAL_CAPSULE | ORAL | Status: AC
  Administered 2022-11-16: 300 mg via ORAL
  Filled 2022-11-16: qty 1

## 2022-11-16 MED ORDER — ATENOLOL 50 MG PO TABS
50.0000 mg | ORAL_TABLET | Freq: Every day | ORAL | Status: DC
  Administered 2022-11-17: 50 mg via ORAL
  Filled 2022-11-16: qty 1

## 2022-11-16 MED ORDER — PROPOFOL 10 MG/ML IV BOLUS
INTRAVENOUS | Status: DC | PRN
  Administered 2022-11-16: 30 ug/kg/min via INTRAVENOUS
  Administered 2022-11-16: 150 mg via INTRAVENOUS

## 2022-11-16 MED ORDER — DEXAMETHASONE SODIUM PHOSPHATE 10 MG/ML IJ SOLN
INTRAMUSCULAR | Status: AC
  Filled 2022-11-16: qty 1

## 2022-11-16 MED ORDER — OLMESARTAN-AMLODIPINE-HCTZ 40-5-25 MG PO TABS: 1.0000 | ORAL_TABLET | Freq: Every day | ORAL | Status: DC

## 2022-11-16 MED ORDER — LACTATED RINGERS IV SOLN: INTRAVENOUS | Status: DC

## 2022-11-16 MED ORDER — ACETAMINOPHEN 650 MG RE SUPP: 650.0000 mg | RECTAL | Status: DC | PRN

## 2022-11-16 MED ORDER — IRBESARTAN 150 MG PO TABS
300.0000 mg | ORAL_TABLET | Freq: Every day | ORAL | Status: DC
  Administered 2022-11-16 – 2022-11-17 (×2): 300 mg via ORAL
  Filled 2022-11-16 (×2): qty 2

## 2022-11-16 MED ORDER — CHLORHEXIDINE GLUCONATE 0.12 % MT SOLN
15.0000 mL | Freq: Once | OROMUCOSAL | Status: AC
  Administered 2022-11-16: 15 mL via OROMUCOSAL
  Filled 2022-11-16: qty 15

## 2022-11-16 MED ORDER — PROPOFOL 10 MG/ML IV BOLUS
INTRAVENOUS | Status: AC
  Filled 2022-11-16: qty 20

## 2022-11-16 MED ORDER — CEFAZOLIN SODIUM-DEXTROSE 2-4 GM/100ML-% IV SOLN
2.0000 g | Freq: Three times a day (TID) | INTRAVENOUS | Status: AC
  Administered 2022-11-16 (×2): 2 g via INTRAVENOUS
  Filled 2022-11-16 (×2): qty 100

## 2022-11-16 MED ORDER — GABAPENTIN 300 MG PO CAPS: 300.0000 mg | ORAL_CAPSULE | ORAL | Status: AC

## 2022-11-16 MED ORDER — SUGAMMADEX SODIUM 200 MG/2ML IV SOLN
INTRAVENOUS | Status: DC | PRN
  Administered 2022-11-16: 200 mg via INTRAVENOUS

## 2022-11-16 MED ORDER — THROMBIN (RECOMBINANT) 5000 UNITS EX SOLR
CUTANEOUS | Status: AC
  Filled 2022-11-16: qty 10000

## 2022-11-16 MED ORDER — CEFAZOLIN SODIUM-DEXTROSE 2-4 GM/100ML-% IV SOLN
2.0000 g | INTRAVENOUS | Status: AC
  Administered 2022-11-16: 2 g via INTRAVENOUS

## 2022-11-16 MED ORDER — FENTANYL CITRATE (PF) 100 MCG/2ML IJ SOLN
25.0000 ug | INTRAMUSCULAR | Status: DC | PRN
  Administered 2022-11-16 (×2): 25 ug via INTRAVENOUS

## 2022-11-16 SURGICAL SUPPLY — 53 items
APL SKNCLS STERI-STRIP NONHPOA (GAUZE/BANDAGES/DRESSINGS) ×1
BAG COUNTER SPONGE SURGICOUNT (BAG) ×1 IMPLANT
BAG SPNG CNTER NS LX DISP (BAG) ×1
BASKET BONE COLLECTION (BASKET) IMPLANT
BENZOIN TINCTURE PRP APPL 2/3 (GAUZE/BANDAGES/DRESSINGS) ×1 IMPLANT
BUR CARBIDE MATCH 3.0 (BURR) ×1 IMPLANT
CANISTER SUCT 3000ML PPV (MISCELLANEOUS) ×1 IMPLANT
CLSR STERI-STRIP ANTIMIC 1/2X4 (GAUZE/BANDAGES/DRESSINGS) IMPLANT
DRAIN JACKSON PRT FLT 7MM (DRAIN) IMPLANT
DRAIN RELI 100 BL SUC LF ST (DRAIN) ×1
DRAPE C-ARM 42X72 X-RAY (DRAPES) ×2 IMPLANT
DRAPE LAPAROTOMY 100X72 PEDS (DRAPES) ×1 IMPLANT
DRAPE MICROSCOPE SLANT 54X150 (MISCELLANEOUS) IMPLANT
DRSG OPSITE POSTOP 4X6 (GAUZE/BANDAGES/DRESSINGS) IMPLANT
DURAPREP 6ML APPLICATOR 50/CS (WOUND CARE) ×1 IMPLANT
ELECT COATED BLADE 2.86 ST (ELECTRODE) ×1 IMPLANT
ELECT REM PT RETURN 9FT ADLT (ELECTROSURGICAL) ×1
ELECTRODE REM PT RTRN 9FT ADLT (ELECTROSURGICAL) ×1 IMPLANT
EVACUATOR SILICONE 100CC (DRAIN) IMPLANT
GAUZE 4X4 16PLY ~~LOC~~+RFID DBL (SPONGE) IMPLANT
GLOVE BIO SURGEON STRL SZ7 (GLOVE) ×1 IMPLANT
GLOVE BIO SURGEON STRL SZ8 (GLOVE) ×1 IMPLANT
GLOVE BIOGEL PI IND STRL 7.0 (GLOVE) ×1 IMPLANT
GOWN STRL REUS W/ TWL LRG LVL3 (GOWN DISPOSABLE) IMPLANT
GOWN STRL REUS W/ TWL XL LVL3 (GOWN DISPOSABLE) ×1 IMPLANT
GOWN STRL REUS W/TWL 2XL LVL3 (GOWN DISPOSABLE) IMPLANT
GOWN STRL REUS W/TWL LRG LVL3 (GOWN DISPOSABLE)
GOWN STRL REUS W/TWL XL LVL3 (GOWN DISPOSABLE) ×1
HEMOSTAT POWDER KIT SURGIFOAM (HEMOSTASIS) ×1 IMPLANT
KIT BASIN OR (CUSTOM PROCEDURE TRAY) ×1 IMPLANT
KIT TURNOVER KIT B (KITS) ×1 IMPLANT
NDL HYPO 25X1 1.5 SAFETY (NEEDLE) ×1 IMPLANT
NDL SPNL 20GX3.5 QUINCKE YW (NEEDLE) ×1 IMPLANT
NEEDLE HYPO 25X1 1.5 SAFETY (NEEDLE) ×1 IMPLANT
NEEDLE SPNL 20GX3.5 QUINCKE YW (NEEDLE) ×1 IMPLANT
NS IRRIG 1000ML POUR BTL (IV SOLUTION) ×1 IMPLANT
PACK LAMINECTOMY NEURO (CUSTOM PROCEDURE TRAY) ×1 IMPLANT
PAD ARMBOARD 7.5X6 YLW CONV (MISCELLANEOUS) ×3 IMPLANT
PIN DISTRACTION 14MM (PIN) ×2 IMPLANT
PLATE ACP INSIGNIA 38 2L (Plate) IMPLANT
PUTTY BONE 2.5CC (Putty) IMPLANT
SCREW VA SINGLE LEAD 4X14 (Screw) ×6 IMPLANT
SCREW VA SINGLE LEAD 4X14 ST (Screw) IMPLANT
SPACER IDENTITI 6X16X14 7D (Screw) IMPLANT
SPACER IDENTITI 7X16X14 7D (Spacer) IMPLANT
SPONGE INTESTINAL PEANUT (DISPOSABLE) ×1 IMPLANT
SPONGE SURGIFOAM ABS GEL SZ50 (HEMOSTASIS) ×1 IMPLANT
STRIP CLOSURE SKIN 1/2X4 (GAUZE/BANDAGES/DRESSINGS) ×1 IMPLANT
SUT VIC AB 3-0 SH 8-18 (SUTURE) ×2 IMPLANT
SUT VICRYL 4-0 PS2 18IN ABS (SUTURE) IMPLANT
TOWEL GREEN STERILE (TOWEL DISPOSABLE) ×1 IMPLANT
TOWEL GREEN STERILE FF (TOWEL DISPOSABLE) ×1 IMPLANT
WATER STERILE IRR 1000ML POUR (IV SOLUTION) ×1 IMPLANT

## 2022-11-16 NOTE — Anesthesia Procedure Notes (Signed)
Procedure Name: Intubation Date/Time: 11/16/2022 10:58 AM  Performed by: Loleta , CRNAPre-anesthesia Checklist: Patient identified, Patient being monitored, Timeout performed, Emergency Drugs available and Suction available Patient Re-evaluated:Patient Re-evaluated prior to induction Oxygen Delivery Method: Circle system utilized Preoxygenation: Pre-oxygenation with 100% oxygen Induction Type: IV induction Ventilation: Mask ventilation without difficulty Laryngoscope Size: 3 and Glidescope Grade View: Grade I Tube type: Oral Tube size: 7.0 mm Number of attempts: 1 Airway Equipment and Method: Stylet Placement Confirmation: ETT inserted through vocal cords under direct vision, positive ETCO2 and breath sounds checked- equal and bilateral Secured at: 21 cm Tube secured with: Tape Dental Injury: Teeth and Oropharynx as per pre-operative assessment

## 2022-11-16 NOTE — Op Note (Signed)
11/16/2022  1:53 PM  PATIENT:  Margaret Horn  72 y.o. 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 Decompressive anterior cervical discectomy C4-5 C5-6, 2. Anterior cervical arthrodesis C4 5 C5-6 utilizing a PTI interbody cage packed with locally harvested morcellized autologous bone graft and DBM putty, 3. Anterior cervical plating C4-C6 inclusive utilizing a ATEC plate  SURGEON:  Marikay Alar, MD  ASSISTANTS: Verlin Dike, FNP  ANESTHESIA:   General  EBL: 300 ml  Total I/O In: 100 [IV Piggyback:100] Out: 300 [Blood:300]  BLOOD ADMINISTERED: none  DRAINS: none  SPECIMEN:  none  INDICATION FOR PROCEDURE: This patient presented with neck and shoulder pain. Imaging showed significant spondylosis at C4-5 and C5-6. The patient tried conservative measures without relief. Pain was debilitating. Recommended ACDF with plating. Patient understood the risks, benefits, and alternatives and potential outcomes and wished to proceed.  PROCEDURE DETAILS: Patient was brought to the operating room placed under general endotracheal anesthesia. Patient was placed in the supine position on the operating room table. The neck was prepped with Duraprep and draped in a sterile fashion.   Three cc of local anesthesia was injected and a transverse incision was made on the right side of the neck.  Dissection was carried down thru the subcutaneous tissue and the platysma was  elevated, opened, and undermined with Metzenbaum scissors.  Dissection was then carried out thru an avascular plane leaving the sternocleidomastoid carotid artery and jugular vein laterally and the trachea and esophagus medially with the assistance of my nurse practitioner. The ventral aspect of the vertebral column was identified and a localizing  x-ray was taken. The C5-6 level was identified and all in the room agreed with the level. The longus colli muscles were then elevated and the retractor was placed with the assistance of my nurse practitioner. The annulus was incised and the disc space entered at C4-5 and C5-6. Discectomy was performed with micro-curettes and pituitary rongeurs. I then used the high-speed drill to drill the endplates down to the level of the posterior longitudinal ligament. The drill shavings were saved in a mucous trap for later arthrodesis. The operating microscope was draped and brought into the field provided additional magnification, illumination and visualization. Discectomy was continued posteriorly thru the disc space. Posterior longitudinal ligament was opened with a nerve hook, and then removed along with disc herniation and osteophytes, decompressing the spinal canal and thecal sac. We then continued to remove osteophytic overgrowth and disc material decompressing the neural foramina and exiting nerve roots bilaterally. The scope was angled up and down to help decompress and undercut the vertebral bodies.  I have removed the uncovertebral joints at C4-5 and C5-6 on the right.  There was bleeding from the foramen at C5-6 on the right from just inferior to the nerve root in the foramen.  It was not pulsatile and appeared to be venous but took time to stop with bipolar cautery and with Surgifoam.  Once the decompression was completed we could pass a nerve hook circumferentially to assure adequate decompression in the midline and in the neural foramina. So by both visualization and palpation we felt we had an adequate decompression of the neural elements. We then measured the height of the intravertebral disc space and selected a 7 millimeter PTI interbody cage packed with autograft and DBM for C4-5 and a 6 mm  graft at C5-6. It was then gently positioned in the intravertebral disc space(s) and countersunk. I then used a ATEC  plate and placed 14 mm variable angle screws into the vertebral bodies of each level and locked them into position. The wound was irrigated with bacitracin solution, checked for hemostasis which was established and confirmed. Once meticulous hemostasis was achieved, placed a 7 flat JP drain and we then proceeded with closure with the assistance of my nurse practitioner. The platysma was closed with interrupted 3-0 undyed Vicryl suture, the subcuticular layer was closed with interrupted 3-0 undyed Vicryl suture. The skin edges were approximated with steristrips. The drapes were removed. A sterile dressing was applied. The patient was then awakened from general anesthesia and transferred to the recovery room in stable condition. At the end of the procedure all sponge, needle and instrument counts were correct.   PLAN OF CARE: Admit for overnight observation  PATIENT DISPOSITION:  PACU - hemodynamically stable.   Delay start of Pharmacological VTE agent (>24hrs) due to surgical blood loss or risk of bleeding:  yes

## 2022-11-16 NOTE — Anesthesia Postprocedure Evaluation (Signed)
Anesthesia Post Note  Patient: Margaret Horn  Procedure(s) Performed: ANTERIOR CERVICAL DISCECTOMY AND FUSION CERVICAL FOUR-FIVE/FIVE-SIX     Patient location during evaluation: PACU Anesthesia Type: General Level of consciousness: awake and alert Pain management: pain level controlled Vital Signs Assessment: post-procedure vital signs reviewed and stable Respiratory status: spontaneous breathing, nonlabored ventilation and respiratory function stable Cardiovascular status: stable and blood pressure returned to baseline Anesthetic complications: no   No notable events documented.  Last Vitals:  Vitals:   11/16/22 1515 11/16/22 1548  BP: (!) 158/70 (!) 154/96  Pulse: (!) 53 60  Resp: 11 14  Temp: 36.4 C 36.6 C  SpO2: 97% 93%    Last Pain:  Vitals:   11/16/22 1548  TempSrc: Oral  PainSc: 6                  Beryle Lathe

## 2022-11-16 NOTE — Progress Notes (Signed)
Orthopedic Tech Progress Note Patient Details:  Dilia Tarver [facility] 01-24-1951 [id number]  Ortho Devices Type of Ortho Device: Soft collar Ortho Device/Splint Interventions: Application, Adjustment, Ordered   Post Interventions Patient Tolerated: Well Instructions Provided: Care of device, Adjustment of device  [place] A Gerilyn Pilgrim 11/16/2022, 9:24 PM

## 2022-11-16 NOTE — Progress Notes (Signed)
Patient ID: Margaret Horn, female   DOB: [birth year], 72 y.o.   MRN: [id number] Look good post-op, no arm pain or NTW, neck and shoulders sore, good strength, has walked to BR, dressing flat

## 2022-11-16 NOTE — Anesthesia Preprocedure Evaluation (Addendum)
Anesthesia Evaluation  Patient identified by MRN, date of birth, ID band Patient awake    Reviewed: Allergy & Precautions, NPO status , Patient's Chart, lab work & pertinent test results, reviewed documented beta blocker date and time   History of Anesthesia Complications Negative for: history of anesthetic complications  Airway Mallampati: II  TM Distance: >3 FB Neck ROM: Limited    Dental  (+) Dental Advisory Given, Teeth Intact   Pulmonary neg pulmonary ROS   Pulmonary exam normal        Cardiovascular hypertension, Pt. on medications and Pt. on home beta blockers Normal cardiovascular exam     Neuro/Psych  PSYCHIATRIC DISORDERS Anxiety Depression       GI/Hepatic negative GI ROS, Neg liver ROS,,,  Endo/Other   Pre-DM   Renal/GU negative Renal ROS     Musculoskeletal  (+) Arthritis ,    Abdominal   Peds  Hematology negative hematology ROS (+)   Anesthesia Other Findings   Reproductive/Obstetrics                             Anesthesia Physical Anesthesia Plan  ASA: 3  Anesthesia Plan: General   Post-op Pain Management: Tylenol PO (pre-op)* and Gabapentin PO (pre-op)*   Induction: Intravenous  PONV Risk Score and Plan: 3 and Treatment may vary due to age or medical condition, Ondansetron and Dexamethasone  Airway Management Planned: Oral ETT and Video Laryngoscope Planned  Additional Equipment: None  Intra-op Plan:   Post-operative Plan: Extubation in OR  Informed Consent: I have reviewed the patients History and Physical, chart, labs and discussed the procedure including the risks, benefits and alternatives for the proposed anesthesia with the patient or authorized representative who has indicated his/her understanding and acceptance.     Dental advisory given  Plan Discussed with: CRNA and Anesthesiologist  Anesthesia Plan Comments:         Anesthesia Quick  Evaluation

## 2022-11-16 NOTE — H&P (Signed)
Subjective:   Patient is a 72 y.o. female admitted for neck pain. The patient first presented to me with complaints of neck pain. Onset of symptoms was several months ago. The pain is described as aching and occurs all day. The pain is rated severe, and is located in the neck and radiates to the shoulders R>L. The symptoms have been progressive. Symptoms are exacerbated by bending to left side, and are relieved by none.  Previous work up includes CT of cervical spine, results: spinal stenosis.  Past Medical History:  Diagnosis Date   Anxiety    Arthritis    Depression    Hypertension    Pre-diabetes     Past Surgical History:  Procedure Laterality Date   APPENDECTOMY     30s with hysterectomy   CATARACT EXTRACTION W/ INTRAOCULAR LENS IMPLANT Left    CHOLECYSTECTOMY     1990s   COLONOSCOPY  2024   HAND SURGERY Right    Dr. Amanda Pea placed cartilage   LAPAROSCOPIC TOTAL HYSTERECTOMY     1980s   POSTERIOR CERVICAL FUSION/FORAMINOTOMY N/A 05/11/2022   Procedure: Posterior cervical fusion with lateral mass fixation - Cervical one- Cervical four;  Surgeon: Tia Alert, MD;  Location: [facility] OR;  Service: Neurosurgery;  Laterality: N/A;   REDUCTION MAMMAPLASTY Bilateral 1980's    Allergies  Allergen Reactions   Aspirin Other (See Comments)    GI Distress   Codeine Other (See Comments)    Hallucinations   Paxil [Paroxetine Hcl]     Weight gain   Wellbutrin [Bupropion] Other (See Comments)    Ineffective   Zetia [Ezetimibe] Diarrhea   Ziac [Bisoprolol-Hydrochlorothiazide] Other (See Comments)    Joint pain   Zoloft [Sertraline] Other (See Comments)    numbness   Zoster Vac Recomb Adjuvanted Other (See Comments)    Chest pain after 1st dose   Ciprofloxacin Rash   Latex Rash    Social History   Tobacco Use   Smoking status: Never   Smokeless tobacco: Never  Substance Use Topics   Alcohol use: Not Currently    Comment: Occasionally    History reviewed. No pertinent  family history. Prior to Admission medications   Medication Sig Start Date End Date Taking? Authorizing Provider  Acetylcysteine (NAC) 600 MG CAPS Take 600 mg by mouth daily.   Yes [provider]  ascorbic acid (VITAMIN C) 500 MG tablet Take 500 mg by mouth daily.   Yes [provider]  atenolol (TENORMIN) 50 MG tablet Take 50 mg by mouth daily. 02/14/22  Yes [provider]  atorvastatin (LIPITOR) 20 MG tablet Take 20 mg by mouth daily. 02/11/22  Yes [provider]  B Complex-C (SUPER B COMPLEX PO) Take 1 tablet by mouth daily.   Yes [provider]  Cholecalciferol (VITAMIN D-3) 125 MCG (5000 UT) TABS Take 5,000 Units by mouth daily.   Yes [provider]  Coenzyme Q10 (CO Q10) 60 MG CAPS Take 60 mg by mouth daily.   Yes [provider]  COLLAGEN PO Take 2,000 mg by mouth daily. With Biotin   Yes [provider]  Multiple Vitamin (MULTIVITAMIN WITH MINERALS) TABS tablet Take 1 tablet by mouth daily.   Yes [provider]  naproxen sodium (ALEVE) 220 MG tablet Take 220 mg by mouth in the morning.   Yes [provider]  Olmesartan-amLODIPine-HCTZ 40-5-25 MG TABS Take 1 tablet by mouth daily. 02/11/22  Yes [provider]  OVER THE COUNTER MEDICATION  Take 1 capsule by mouth daily. Juice Festive Vegetable Blend   Yes [provider]  OVER THE COUNTER MEDICATION Take 1 capsule by mouth daily. Juice Festive Fruit Blend   Yes [provider]  Potassium 99 MG TABS Take 99 mg by mouth daily.   Yes [provider]  pyridOXINE (VITAMIN B6) 100 MG tablet Take 200 mg by mouth daily.   Yes [provider]  QUERCETIN PO Take 1 capsule by mouth daily.   Yes [provider]  zinc gluconate 50 MG tablet Take 50 mg by mouth daily.   Yes [provider]  methocarbamol (ROBAXIN-750) 750 MG tablet Take 1 tablet (750 mg total) by mouth 4 (four) times daily. 05/12/22    Meyran, Tiana Loft, NP  oxyCODONE-acetaminophen (PERCOCET) 5-325 MG tablet Take 1 tablet by mouth every 4 (four) hours as needed for severe pain. 05/12/22 05/12/23  MeyranTiana Loft, NP     Review of Systems  Positive ROS: neg  All other systems have been reviewed and were otherwise negative with the exception of those mentioned in the HPI and as above.  Objective: Vital signs in last 24 hours: Temp:  [97.6 F (36.4 C)] 97.6 F (36.4 C) (08/14 0826) Pulse Rate:  [62] 62 (08/14 0826) Resp:  [20] 20 (08/14 0826) BP: (169)/(98) 169/98 (08/14 0826) SpO2:  [96 %] 96 % (08/14 0826) Weight:  [77.1 kg] 77.1 kg (08/14 0826)  General Appearance: Alert, cooperative, no distress, appears stated age Head: Normocephalic, without obvious abnormality, atraumatic Eyes: PERRL, conjunctiva/corneas clear, EOM's intact      Neck: Supple, symmetrical, trachea midline, Back: Symmetric, no curvature, ROM normal, no CVA tenderness Lungs:  respirations unlabored Heart: Regular rate and rhythm Abdomen: Soft, non-tender Extremities: Extremities normal, atraumatic, no cyanosis or edema Pulses: 2+ and symmetric all extremities Skin: Skin color, texture, turgor normal, no rashes or lesions  NEUROLOGIC:  Mental status: Alert and oriented x4, no aphasia, good attention span, fund of knowledge and memory  Motor Exam - grossly normal Sensory Exam - grossly normal Reflexes: 1= Coordination - grossly normal Gait - grossly normal Balance - grossly normal Cranial Nerves: I: smell Not tested  II: visual acuity  OS: nl    OD: nl  II: visual fields Full to confrontation  II: pupils Equal, round, reactive to light  III,VII: ptosis None  III,IV,VI: extraocular muscles  Full ROM  V: mastication Normal  V: facial light touch sensation  Normal  V,VII: corneal reflex  Present  VII: facial muscle function - upper  Normal  VII: facial muscle function - lower Normal  VIII: hearing Not tested  IX: soft  palate elevation  Normal  IX,X: gag reflex Present  XI: trapezius strength  5/5  XI: sternocleidomastoid strength 5/5  XI: neck flexion strength  5/5  XII: tongue strength  Normal    Data Review Lab Results  Component Value Date   WBC 5.6 11/07/2022   HGB 14.7 11/07/2022   HCT 43.0 11/07/2022   MCV 93.7 11/07/2022   PLT 180 11/07/2022   Lab Results  Component Value Date   NA 140 11/07/2022   K 3.7 11/07/2022   CL 101 11/07/2022   CO2 27 11/07/2022   BUN 18 11/07/2022   CREATININE 0.91 11/07/2022   GLUCOSE 119 (H) 11/07/2022   Lab Results  Component Value Date   INR 1.0 11/07/2022    Assessment:   Cervical neck pain with herniated nucleus pulposus/ spondylosis/ stenosis at C4-5 C5-6.  Estimated body mass index is 27.44 kg/m as calculated from the following:   Height as of this encounter: 5\' 6"  (1.676 m).   Weight as of this encounter: 77.1 kg.  Patient has failed conservative therapy. Planned surgery : ACDF C4-5 C5-6  Plan:   I explained the condition and procedure to the patient and answered any questions.  Patient wishes to proceed with procedure as planned. Understands risks/ benefits/ and expected or typical outcomes.  Tia Alert 11/16/2022 10:33 AM

## 2022-11-16 NOTE — Transfer of Care (Signed)
Immediate Anesthesia Transfer of Care Note  Patient: Margaret Horn  Procedure(s) Performed: ANTERIOR CERVICAL DISCECTOMY AND FUSION CERVICAL FOUR-FIVE/FIVE-SIX  Patient Location: PACU  Anesthesia Type:General  Level of Consciousness: awake  Airway & Oxygen Therapy: Patient Spontanous Breathing and Patient connected to nasal cannula oxygen  Post-op Assessment: Report given to RN and Post -op Vital signs reviewed and stable  Post vital signs: Reviewed and stable  Last Vitals:  Vitals Value Taken Time  BP 158/78 11/16/22 1406  Temp    Pulse 59 11/16/22 1411  Resp 21 11/16/22 1411  SpO2 92 % 11/16/22 1411  Vitals shown include unfiled device data.  Last Pain:  Vitals:   11/16/22 0826  TempSrc: Oral         Complications: No notable events documented.

## 2022-11-17 ENCOUNTER — Encounter (HOSPITAL_COMMUNITY): Payer: Self-pay | Admitting: Neurological Surgery

## 2022-11-17 DIAGNOSIS — M47892 Other spondylosis, cervical region: Secondary | ICD-10-CM | POA: Diagnosis not present

## 2022-11-17 MED ORDER — OXYCODONE-ACETAMINOPHEN 5-325 MG PO TABS: 1.0000 | ORAL_TABLET | Freq: Four times a day (QID) | ORAL | 0 refills | Status: AC | PRN

## 2022-11-17 MED ORDER — METHOCARBAMOL 750 MG PO TABS: 750.0000 mg | ORAL_TABLET | Freq: Four times a day (QID) | ORAL | 0 refills | Status: AC

## 2022-11-17 MED FILL — Thrombin For Soln 5000 Unit: CUTANEOUS | Qty: 5000 | Status: AC

## 2022-11-17 NOTE — Plan of Care (Signed)

## 2022-11-17 NOTE — Discharge Summary (Signed)
Physician Discharge Summary  Patient ID: Margaret Horn MRN: [id number] DOB/AGE: [birth year] 72 y.o.  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 The patient tolerated the procedure well and was taken to the recovery room and then to the floor in stable condition. The hospital course was routine. There were no complications. The wound remained clean dry and intact. Pt had appropriate neck soreness. No complaints of arm pain or new N/T/W. The patient remained afebrile with stable vital signs, and tolerated a regular diet. The patient continued to increase activities, and pain was well controlled with oral pain medications.   Consults: None  Significant Diagnostic Studies:  Results for orders placed or performed during the hospital encounter of 11/07/22  Surgical pcr screen   Specimen: Nasal Mucosa; Nasal Swab  Result Value Ref Range   MRSA, PCR NEGATIVE NEGATIVE   Staphylococcus aureus POSITIVE (A) NEGATIVE  Protime-INR  Result Value Ref Range   Prothrombin Time 13.0 11.4 - 15.2 seconds   INR 1.0 0.8 - 1.2  Basic metabolic panel per protocol  Result Value Ref Range   Sodium 140 135 - 145 mmol/L   Potassium 3.7 3.5 - 5.1 mmol/L   Chloride 101 98 - 111 mmol/L   CO2 27 22 - 32 mmol/L   Glucose, Bld 119 (H) 70 - 99 mg/dL   BUN 18 8 - 23 mg/dL   Creatinine, Ser 4.09 0.44 - 1.00 mg/dL   Calcium 9.7 8.9 - 81.1 mg/dL   GFR, Estimated >91 >47 mL/min   Anion gap 12 5 - 15  CBC per protocol  Result Value Ref Range   WBC 5.6 4.0 - 10.5 K/uL   RBC 4.59 3.87 - 5.11 MIL/uL   Hemoglobin 14.7 12.0 - 15.0 g/dL   HCT 82.9 56.2  - 13.0 %   MCV 93.7 80.0 - 100.0 fL   MCH 32.0 26.0 - 34.0 pg   MCHC 34.2 30.0 - 36.0 g/dL   RDW 86.5 78.4 - 69.6 %   Platelets 180 150 - 400 K/uL   nRBC 0.0 0.0 - 0.2 %    DG Cervical Spine 1 View  Result Date: 11/16/2022 CLINICAL DATA:  Elective surgery. Anterior cervical discectomy and fusion. EXAM: Intraoperative fluoroscopy COMPARISON:  Preop x-rays 09/22/2022 FINDINGS: Four fluoroscopic spot images submitted for review demonstrate placement of fixation screws, plate and prosthetic disc material seen at multiple levels including C4 through C6. Expected alignment. ET tube in place. Imaging was obtained to aid in treatment. Please correlate with real-time fluoroscopy of 9 seconds. Cumulative dose 1.51 mGy IMPRESSION: Intraoperative fluoroscopy Electronically Signed   By: Karen Kays M.D.   On: 11/16/2022 16:48   DG C-Arm 1-60 Min-No Report  Result Date: 11/16/2022 Fluoroscopy was utilized by the requesting physician.  No radiographic interpretation.   DG C-Arm 1-60 Min-No Report  Result Date: 11/16/2022 Fluoroscopy was utilized by the requesting physician.  No radiographic interpretation.   DG C-Arm 1-60 Min-No Report  Result Date: 11/16/2022 Fluoroscopy was utilized by the requesting physician.  No radiographic interpretation.    Antibiotics:  Anti-infectives (From admission, onward)    Start     Dose/Rate Route Frequency Ordered Stop  11/16/22 1630  ceFAZolin (ANCEF) IVPB 2g/100 mL premix        2 g 200 mL/hr over 30 Minutes Intravenous Every 8 hours 11/16/22 1535 11/17/22 0014   11/16/22 0830  ceFAZolin (ANCEF) IVPB 2g/100 mL premix        2 g 200 mL/hr over 30 Minutes Intravenous On call to O.R. 11/16/22 0820 11/16/22 1130   11/16/22 0822  ceFAZolin (ANCEF) 2-4 GM/100ML-% IVPB       Note to Pharmacy: Lacie Draft A: cabinet override      11/16/22 0822 11/16/22 1126       Discharge Exam: Blood pressure (!) 148/77, pulse (!) 56, temperature (!) 97.5 F (36.4 C),  temperature source Oral, resp. rate 19, height 5\' 6"  (1.676 m), weight 77.1 kg, SpO2 94%. Neurologic: Grossly normal Ambulating and voiding well incision cdi   Discharge Medications:   Allergies as of 11/17/2022       Reactions   Aspirin Other (See Comments)   GI Distress   Codeine Other (See Comments)   Hallucinations   Paxil [paroxetine Hcl]    Weight gain   Wellbutrin [bupropion] Other (See Comments)   Ineffective   Zetia [ezetimibe] Diarrhea   Ziac [bisoprolol-hydrochlorothiazide] Other (See Comments)   Joint pain   Zoloft [sertraline] Other (See Comments)   numbness   Zoster Vac Recomb Adjuvanted Other (See Comments)   Chest pain after 1st dose   Ciprofloxacin Rash   Latex Rash        Medication List     TAKE these medications    ascorbic acid 500 MG tablet Commonly known as: VITAMIN C Take 500 mg by mouth daily.   atenolol 50 MG tablet Commonly known as: TENORMIN Take 50 mg by mouth daily.   atorvastatin 20 MG tablet Commonly known as: LIPITOR Take 20 mg by mouth daily.   Co Q10 60 MG Caps Take 60 mg by mouth daily.   COLLAGEN PO Take 2,000 mg by mouth daily. With Biotin   methocarbamol 750 MG tablet Commonly known as: Robaxin-750 Take 1 tablet (750 mg total) by mouth 4 (four) times daily.   multivitamin with minerals Tabs tablet Take 1 tablet by mouth daily.   NAC 600 MG Caps Generic drug: Acetylcysteine Take 600 mg by mouth daily.   naproxen sodium 220 MG tablet Commonly known as: ALEVE Take 220 mg by mouth in the morning.   Olmesartan-amLODIPine-HCTZ 40-5-25 MG Tabs Take 1 tablet by mouth daily.   OVER THE COUNTER MEDICATION Take 1 capsule by mouth daily. Juice Festive Vegetable Blend   OVER THE COUNTER MEDICATION Take 1 capsule by mouth daily. Juice Festive Fruit Blend   oxyCODONE-acetaminophen 5-325 MG tablet Commonly known as: Percocet Take 1 tablet by mouth every 6 (six) hours as needed for severe pain. What changed: when to  take this   Potassium 99 MG Tabs Take 99 mg by mouth daily.   pyridOXINE 100 MG tablet Commonly known as: VITAMIN B6 Take 200 mg by mouth daily.   QUERCETIN PO Take 1 capsule by mouth daily.   SUPER B COMPLEX PO Take 1 tablet by mouth daily.   Vitamin D-3 125 MCG (5000 UT) Tabs Take 5,000 Units by mouth daily.   zinc gluconate 50 MG tablet Take 50 mg by mouth daily.        Disposition: home   Final Dx: acdf C4-5, C5-6  Discharge Instructions      Remove dressing in 72 hours   Complete by: As  directed    Call MD for:  difficulty breathing, headache or visual disturbances   Complete by: As directed    Call MD for:  hives   Complete by: As directed    Call MD for:  persistant dizziness or light-headedness   Complete by: As directed    Call MD for:  persistant nausea and vomiting   Complete by: As directed    Call MD for:  redness, tenderness, or signs of infection (pain, swelling, redness, odor or green/yellow discharge around incision site)   Complete by: As directed    Call MD for:  severe uncontrolled pain   Complete by: As directed    Call MD for:  temperature >100.4   Complete by: As directed    Diet - low sodium heart healthy   Complete by: As directed    Driving Restrictions   Complete by: As directed    No driving for 2 weeks, no riding in the car for 1 week   Increase activity slowly   Complete by: As directed    Lifting restrictions   Complete by: As directed    No lifting more than 8 lbs          Signed: Tiana Loft  11/17/2022, 7:54 AM

## 2022-11-17 NOTE — Progress Notes (Signed)
Patient d/c home per order, d/c instructions explain and given, surgical site clean and dry no sign of infection, pt. Void and ambulate, alert and oriented.

## 2022-11-21 ENCOUNTER — Encounter (HOSPITAL_COMMUNITY): Payer: Self-pay | Admitting: Neurological Surgery

## 2023-01-23 ENCOUNTER — Other Ambulatory Visit: Payer: Self-pay | Admitting: Family Medicine

## 2023-01-23 DIAGNOSIS — Z1231 Encounter for screening mammogram for malignant neoplasm of breast: Secondary | ICD-10-CM

## 2023-02-16 ENCOUNTER — Ambulatory Visit
Admission: RE | Admit: 2023-02-16 | Discharge: 2023-02-16 | Disposition: A | Discharge status: Home / Self Care | Payer: Medicare Other | Source: Ambulatory Visit | Attending: Family Medicine | Admitting: Family Medicine

## 2023-02-16 DIAGNOSIS — Z1231 Encounter for screening mammogram for malignant neoplasm of breast: Secondary | ICD-10-CM

## 2023-12-14 ENCOUNTER — Other Ambulatory Visit (HOSPITAL_BASED_OUTPATIENT_CLINIC_OR_DEPARTMENT_OTHER): Payer: Self-pay | Admitting: Family Medicine

## 2023-12-14 DIAGNOSIS — E2839 Other primary ovarian failure: Secondary | ICD-10-CM

## 2024-12-04 ENCOUNTER — Ambulatory Visit (HOSPITAL_BASED_OUTPATIENT_CLINIC_OR_DEPARTMENT_OTHER)
